# Patient Record
Sex: Male | Born: 1940 | Race: White | Hispanic: No | Marital: Single | Smoking: Former smoker
Health system: Southern US, Community
[De-identification: ages and names within clinical notes are randomized; demographics above are authoritative.]

## PROBLEM LIST (undated history)

## (undated) DIAGNOSIS — E119 Type 2 diabetes mellitus without complications: Secondary | ICD-10-CM

## (undated) DIAGNOSIS — E785 Hyperlipidemia, unspecified: Secondary | ICD-10-CM

## (undated) DIAGNOSIS — E039 Hypothyroidism, unspecified: Secondary | ICD-10-CM

## (undated) DIAGNOSIS — I2510 Atherosclerotic heart disease of native coronary artery without angina pectoris: Secondary | ICD-10-CM

## (undated) HISTORY — DX: Hyperlipidemia, unspecified: E78.5

## (undated) HISTORY — PX: OTHER SURGICAL HISTORY: SHX169

## (undated) HISTORY — DX: Atherosclerotic heart disease of native coronary artery without angina pectoris: I25.10

## (undated) HISTORY — PX: KNEE CARTILAGE SURGERY: SHX688

## (undated) HISTORY — PX: INGUINAL HERNIA REPAIR: SUR1180

---

## 2003-02-15 ENCOUNTER — Ambulatory Visit (HOSPITAL_COMMUNITY): Admission: RE | Admit: 2003-02-15 | Discharge: 2003-02-15 | Payer: Self-pay | Admitting: Orthopaedic Surgery

## 2003-02-15 ENCOUNTER — Ambulatory Visit (HOSPITAL_BASED_OUTPATIENT_CLINIC_OR_DEPARTMENT_OTHER): Admission: RE | Admit: 2003-02-15 | Discharge: 2003-02-15 | Payer: Self-pay | Admitting: Orthopaedic Surgery

## 2004-07-09 ENCOUNTER — Ambulatory Visit: Payer: Self-pay | Admitting: Critical Care Medicine

## 2004-07-17 ENCOUNTER — Ambulatory Visit: Payer: Self-pay | Admitting: Critical Care Medicine

## 2005-09-11 ENCOUNTER — Ambulatory Visit (HOSPITAL_COMMUNITY): Admission: RE | Admit: 2005-09-11 | Discharge: 2005-09-11 | Payer: Self-pay | Admitting: Orthopaedic Surgery

## 2008-05-12 ENCOUNTER — Encounter: Admission: RE | Admit: 2008-05-12 | Discharge: 2008-05-12 | Payer: Self-pay | Admitting: Otolaryngology

## 2010-07-13 NOTE — Op Note (Signed)
[name], [name]                           ACCOUNT NO.:  [id number]   MEDICAL RECORD NO.:  [id number]                   PATIENT TYPE:  AMB   LOCATION:  DSC                                  FACILITY:  [facility]   PHYSICIAN:  Claude Manges. Cleophas Dunker, M.D.            DATE OF BIRTH:  Jun 26, 1940   DATE OF PROCEDURE:  02/15/2003  DATE OF DISCHARGE:                                 OPERATIVE REPORT   PREOPERATIVE DIAGNOSIS:  Tear medial meniscus right knee.   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   PROCEDURE:  1. Diagnostic arthroscopy right knee.  2. Partial medial meniscectomy.  3. Shaving of patella and medial femoral condyle.   SURGEON:  Claude Manges. Cleophas Dunker, M.D.   ANESTHESIA:  Intravenous sedation and local knee block.   COMPLICATIONS:  None.   INDICATIONS FOR PROCEDURE:  The patient is a 70 year old gentleman  who  experienced onset of right knee pain approximately  2 months ago while  walking down some stairs. He has continued to have pain along the medial  compartment of his left knee that has partially responded to cortisone  injection. He has had an MRI scan that reveals a complex tear involving the  posterior  horn of the medial meniscus. He is now to have an arthroscopic  evaluation.   DESCRIPTION OF PROCEDURE:  With the patient comfortable on the operating  table and under IV sedation, the right lower extremity was placed in a thigh  holder. The leg was then prepped with Duraprep from the thigh holder to the  mid foot. Sterile draping was performed. The patient did have a knee block  preoperatively.   A diagnostic arthroscopy was performed using a medial and lateral  peripatellar tendon stab wound. There was a minimal clear joint effusion.  Diagnostic arthroscopy revealed chondromalacia of the patella diffusely. I  did not see a patellar tilt. There were no loose bodies and very minimal  synovitis in the superior  pouch.   Shaving of the patella was performed with a 4.2 coude shaver. Both gutters  were cleared. The lateral  compartment was cleared of meniscal pathology or  chondromalacia. The ACL appeared  to be intact.   The medial compartment revealed a localized area of chondromalacia of the  medial femoral condyle juxtaposed to the posterior  horn of the medial  meniscus. This area was debrided with the coude shaver.   The meniscus was then probed. The posterior third was torn in several  locations, consistent with a complex tear with both horizontal cleavage and  flap tears. These areas were debrided with a basket forceps and then the  intraarticular shaver. The remaining meniscal rim was carefully  probed and  was intact and nicely tapered anteriorly.   The joint  was then explored without evidence of loose material. The 2 stab  wounds were  left open and infiltrated with 0.25% Marcaine with epinephrine.  A sterile bulky dressing  was applied followed  by an Ace bandage.   PLAN:  Percocet  for pain, office in one week.                                               Claude Manges. Cleophas Dunker, M.D.    PWW/MEDQ  D:  02/15/2003  T:  02/15/2003  Job:  [id number]

## 2012-08-02 ENCOUNTER — Encounter (HOSPITAL_COMMUNITY): Payer: Self-pay | Admitting: *Deleted

## 2012-08-02 ENCOUNTER — Inpatient Hospital Stay (HOSPITAL_COMMUNITY)
Admission: EM | Admit: 2012-08-02 | Discharge: 2012-08-04 | DRG: 247 | Disposition: A | Discharge status: Home / Self Care | Payer: Medicare Other | Attending: Cardiology | Admitting: Cardiology

## 2012-08-02 ENCOUNTER — Emergency Department (HOSPITAL_COMMUNITY): Payer: Medicare Other

## 2012-08-02 DIAGNOSIS — Z7902 Long term (current) use of antithrombotics/antiplatelets: Secondary | ICD-10-CM

## 2012-08-02 DIAGNOSIS — I10 Essential (primary) hypertension: Secondary | ICD-10-CM | POA: Diagnosis present

## 2012-08-02 DIAGNOSIS — Z955 Presence of coronary angioplasty implant and graft: Secondary | ICD-10-CM

## 2012-08-02 DIAGNOSIS — Z79899 Other long term (current) drug therapy: Secondary | ICD-10-CM

## 2012-08-02 DIAGNOSIS — E119 Type 2 diabetes mellitus without complications: Secondary | ICD-10-CM | POA: Diagnosis present

## 2012-08-02 DIAGNOSIS — E039 Hypothyroidism, unspecified: Secondary | ICD-10-CM | POA: Diagnosis present

## 2012-08-02 DIAGNOSIS — I2510 Atherosclerotic heart disease of native coronary artery without angina pectoris: Secondary | ICD-10-CM | POA: Diagnosis present

## 2012-08-02 DIAGNOSIS — F17200 Nicotine dependence, unspecified, uncomplicated: Secondary | ICD-10-CM | POA: Diagnosis present

## 2012-08-02 DIAGNOSIS — Z7982 Long term (current) use of aspirin: Secondary | ICD-10-CM

## 2012-08-02 DIAGNOSIS — I214 Non-ST elevation (NSTEMI) myocardial infarction: Principal | ICD-10-CM | POA: Diagnosis present

## 2012-08-02 DIAGNOSIS — Z8249 Family history of ischemic heart disease and other diseases of the circulatory system: Secondary | ICD-10-CM

## 2012-08-02 DIAGNOSIS — I200 Unstable angina: Secondary | ICD-10-CM

## 2012-08-02 HISTORY — DX: Type 2 diabetes mellitus without complications: E11.9

## 2012-08-02 HISTORY — DX: Hypothyroidism, unspecified: E03.9

## 2012-08-02 LAB — POCT I-STAT, CHEM 8
BUN: 18 mg/dL (ref 6–23)
Calcium, Ion: 1.13 mmol/L (ref 1.13–1.30)
Chloride: 101 mEq/L (ref 96–112)
Glucose, Bld: 225 mg/dL — ABNORMAL HIGH (ref 70–99)
Sodium: 134 mEq/L — ABNORMAL LOW (ref 135–145)
TCO2: 25 mmol/L (ref 0–100)

## 2012-08-02 LAB — CBC
HCT: 37.6 % — ABNORMAL LOW (ref 39.0–52.0)
Hemoglobin: 12.7 g/dL — ABNORMAL LOW (ref 13.0–17.0)
MCHC: 33.8 g/dL (ref 30.0–36.0)
Platelets: 165 10*3/uL (ref 150–400)
WBC: 8.4 10*3/uL (ref 4.0–10.5)

## 2012-08-02 LAB — GLUCOSE, CAPILLARY
Glucose-Capillary: 223 mg/dL — ABNORMAL HIGH (ref 70–99)
Glucose-Capillary: 227 mg/dL — ABNORMAL HIGH (ref 70–99)

## 2012-08-02 LAB — POCT I-STAT TROPONIN I: Troponin i, poc: 0.01 ng/mL (ref 0.00–0.08)

## 2012-08-02 LAB — TROPONIN I
Troponin I: 0.33 ng/mL (ref <0.30)
Troponin I: <0.3 ng/mL (ref <0.30)

## 2012-08-02 LAB — PROTIME-INR
INR: 1.01 (ref 0.00–1.49)
INR: 1.09 (ref 0.00–1.49)
Prothrombin Time: 13.2 seconds (ref 11.6–15.2)

## 2012-08-02 LAB — COMPREHENSIVE METABOLIC PANEL
ALT: 31 U/L (ref 0–53)
AST: 28 U/L (ref 0–37)
BUN: 18 mg/dL (ref 6–23)
Calcium: 8.8 mg/dL (ref 8.4–10.5)
Chloride: 97 mEq/L (ref 96–112)
Glucose, Bld: 225 mg/dL — ABNORMAL HIGH (ref 70–99)
Total Protein: 7.1 g/dL (ref 6.0–8.3)

## 2012-08-02 LAB — HEPARIN LEVEL (UNFRACTIONATED): Heparin Unfractionated: 0.28 IU/mL — ABNORMAL LOW (ref 0.30–0.70)

## 2012-08-02 LAB — APTT: aPTT: 29 seconds (ref 24–37)

## 2012-08-02 MED ORDER — NITROGLYCERIN 0.4 MG SL SUBL
SUBLINGUAL_TABLET | SUBLINGUAL | Status: AC
  Administered 2012-08-02: 0.4 mg via SUBLINGUAL
  Filled 2012-08-02: qty 25

## 2012-08-02 MED ORDER — SODIUM CHLORIDE 0.9 % IJ SOLN: 3.0000 mL | INTRAMUSCULAR | Status: DC | PRN

## 2012-08-02 MED ORDER — LEVOTHYROXINE SODIUM 112 MCG PO TABS
112.0000 ug | ORAL_TABLET | Freq: Every day | ORAL | Status: DC
  Administered 2012-08-03 – 2012-08-04 (×2): 112 ug via ORAL
  Filled 2012-08-02 (×4): qty 1

## 2012-08-02 MED ORDER — SODIUM CHLORIDE 0.9 % IV SOLN
INTRAVENOUS | Status: DC
  Administered 2012-08-03: 06:00:00 via INTRAVENOUS

## 2012-08-02 MED ORDER — NITROGLYCERIN 0.4 MG SL SUBL: 0.4000 mg | SUBLINGUAL_TABLET | SUBLINGUAL | Status: DC | PRN

## 2012-08-02 MED ORDER — HEPARIN BOLUS VIA INFUSION: 4000.0000 [IU] | Freq: Once | INTRAVENOUS | Status: DC

## 2012-08-02 MED ORDER — INSULIN ASPART 100 UNIT/ML ~~LOC~~ SOLN
0.0000 [IU] | Freq: Three times a day (TID) | SUBCUTANEOUS | Status: DC
  Administered 2012-08-03: 13:00:00 2 [IU] via SUBCUTANEOUS
  Administered 2012-08-03 – 2012-08-04 (×2): 3 [IU] via SUBCUTANEOUS
  Filled 2012-08-02: qty 0.15

## 2012-08-02 MED ORDER — ASPIRIN 81 MG PO CHEW
324.0000 mg | CHEWABLE_TABLET | ORAL | Status: AC
  Administered 2012-08-03: 324 mg via ORAL
  Filled 2012-08-02: qty 4

## 2012-08-02 MED ORDER — HEPARIN SODIUM (PORCINE) 5000 UNIT/ML IJ SOLN
4000.0000 [IU] | INTRAMUSCULAR | Status: AC
  Administered 2012-08-02: 4000 [IU] via INTRAVENOUS
  Filled 2012-08-02: qty 1

## 2012-08-02 MED ORDER — ATORVASTATIN CALCIUM 80 MG PO TABS
80.0000 mg | ORAL_TABLET | Freq: Every day | ORAL | Status: DC
  Administered 2012-08-03: 80 mg via ORAL
  Filled 2012-08-02 (×3): qty 1

## 2012-08-02 MED ORDER — METOPROLOL TARTRATE 25 MG PO TABS
25.0000 mg | ORAL_TABLET | Freq: Two times a day (BID) | ORAL | Status: DC
  Administered 2012-08-02 – 2012-08-04 (×4): 25 mg via ORAL
  Filled 2012-08-02 (×6): qty 1

## 2012-08-02 MED ORDER — HEPARIN (PORCINE) IN NACL 100-0.45 UNIT/ML-% IJ SOLN
1800.0000 [IU]/h | INTRAMUSCULAR | Status: DC
  Administered 2012-08-02: 1550 [IU]/h via INTRAVENOUS
  Administered 2012-08-02: 1800 [IU]/h via INTRAVENOUS
  Filled 2012-08-02 (×3): qty 250

## 2012-08-02 MED ORDER — NITROGLYCERIN IN D5W 200-5 MCG/ML-% IV SOLN
2.0000 ug/min | Freq: Once | INTRAVENOUS | Status: AC
  Administered 2012-08-02: 5 ug/min via INTRAVENOUS
  Filled 2012-08-02 (×2): qty 250

## 2012-08-02 MED ORDER — ACETAMINOPHEN 325 MG PO TABS: 650.0000 mg | ORAL_TABLET | ORAL | Status: DC | PRN

## 2012-08-02 MED ORDER — SODIUM CHLORIDE 0.9 % IV SOLN: 250.0000 mL | INTRAVENOUS | Status: DC | PRN

## 2012-08-02 MED ORDER — SODIUM CHLORIDE 0.9 % IJ SOLN: 3.0000 mL | Freq: Two times a day (BID) | INTRAMUSCULAR | Status: DC

## 2012-08-02 MED ORDER — SODIUM CHLORIDE 0.9 % IV SOLN
INTRAVENOUS | Status: DC
  Administered 2012-08-02: 20 mL via INTRAVENOUS

## 2012-08-02 MED ORDER — ONDANSETRON HCL 4 MG/2ML IJ SOLN: 4.0000 mg | Freq: Four times a day (QID) | INTRAMUSCULAR | Status: DC | PRN

## 2012-08-02 MED ORDER — ASPIRIN 81 MG PO CHEW
324.0000 mg | CHEWABLE_TABLET | Freq: Once | ORAL | Status: AC
  Administered 2012-08-02: 324 mg via ORAL
  Filled 2012-08-02: qty 4

## 2012-08-02 MED ORDER — GLYBURIDE 5 MG PO TABS
5.0000 mg | ORAL_TABLET | Freq: Two times a day (BID) | ORAL | Status: DC
  Administered 2012-08-03 – 2012-08-04 (×2): 5 mg via ORAL
  Filled 2012-08-02 (×7): qty 1

## 2012-08-02 MED ORDER — ASPIRIN EC 81 MG PO TBEC
81.0000 mg | DELAYED_RELEASE_TABLET | Freq: Every day | ORAL | Status: DC
  Administered 2012-08-04: 10:00:00 81 mg via ORAL
  Filled 2012-08-02: qty 1

## 2012-08-02 NOTE — Consult Note (Addendum)
ANTICOAGULATION CONSULT NOTE - Initial Consult  Pharmacy Consult for Heparin Indication: chest pain/ACS  No Known Allergies  Patient Measurements: Height: 6\' 1"  (185.4 cm) Weight: 285 lb (129.275 kg) IBW/kg (Calculated) : 79.9 Heparin Dosing Weight: ~109kg  Vital Signs: BP: 125/74 mmHg (06/08 1057) Pulse Rate: 75 (06/08 1057)  Labs:  Recent Labs  08/02/12 0931 08/02/12 0952  HGB 12.7* 13.6  HCT 37.6* 40.0  PLT 165  --   APTT 29  --   LABPROT 13.2  --   INR 1.01  --   CREATININE 0.97 1.10    Estimated Creatinine Clearance: 88.1 ml/min (by C-G formula based on Cr of 1.1).   Medical History: Past Medical History  Diagnosis Date  . Diabetes mellitus without complication   . Hypertension   . Thyroid disease     Medications:  No anticoagulants pta  Assessment: 71yom with PMH of diabetes and HTN presents to the ED with CP that has been on and off but worse today. Troponin negative x 1. He will begin heparin. Baseline labs wnl.  Goal of Therapy:  Heparin level 0.3-0.7 units/ml Monitor platelets by anticoagulation protocol: Yes   Plan:  1) Heparin bolus 4000 units x 1 (already given at 0938) 2) Heparin drip at 1550 units/hr 3) 6 hour heparin level 4) Daily heparin level and CBC  Fredrik Rigger 08/02/2012,11:20 AM

## 2012-08-02 NOTE — ED Provider Notes (Signed)
History     CSN: [id number]  Arrival date & time 08/02/12  6962   First MD Initiated Contact with Patient 08/02/12 [phone]      Chief Complaint  Patient presents with  . Chest Pain    (Consider location/radiation/quality/duration/timing/severity/associated sxs/prior treatment) HPI Comments: Patient presents with epigastric chest pain and pressure that onset one hour ago associated with nausea and shortness of breath. He is diaphoretic. EKG shows ST depressions in the inferior leads without reciprocal changes. This is discussed with dr.  Kirke Corin on arrival and he did not think STEMI criteria was met. Patient is a diabetic and hypertensive who has been having chest pain and pressure on and off for greater than 6 months but worse this morning. Denies any vomiting, fever or cough. States he had a stress test maybe 3 years ago. He smokes cigars.  The history is provided by the patient and a relative.    Past Medical History  Diagnosis Date  . Diabetes mellitus without complication   . Hypertension   . Thyroid disease     History reviewed. No pertinent past surgical history.  History reviewed. No pertinent family history.  History  Substance Use Topics  . Smoking status: Current Every Day Smoker    Types: Cigars  . Smokeless tobacco: Not on file  . Alcohol Use: No      Review of Systems  Constitutional: Positive for activity change and appetite change. Negative for fever.  HENT: Negative for congestion and rhinorrhea.   Respiratory: Positive for chest tightness and shortness of breath. Negative for cough.   Cardiovascular: Positive for chest pain.  Gastrointestinal: Positive for nausea. Negative for vomiting and abdominal pain.  Genitourinary: Negative for dysuria, hematuria and testicular pain.  Musculoskeletal: Negative for back pain.  Skin: Negative for rash.  Neurological: Positive for dizziness and light-headedness. Negative for headaches.  A complete 10 system review of  systems was obtained and all systems are negative except as noted in the HPI and PMH.    Allergies  Review of patient's allergies indicates no known allergies.  Home Medications   Current Outpatient Rx  Name  Route  Sig  Dispense  Refill  . aspirin EC 81 MG tablet   Oral   Take 81 mg by mouth daily.         Marland Kitchen glyBURIDE-metformin (GLUCOVANCE) 2.5-500 MG per tablet   Oral   Take 2 tablets by mouth 2 (two) times daily with a meal.         . levothyroxine (SYNTHROID, LEVOTHROID) 112 MCG tablet   Oral   Take 112 mcg by mouth daily before breakfast.         . Misc Natural Products (GLUCOS-CHONDROIT-MSM COMPLEX PO)   Oral   Take 3 tablets by mouth daily.           BP 125/74  Pulse 72  Resp 15  Ht 6\' 1"  (1.854 m)  Wt 285 lb (129.275 kg)  BMI 37.61 kg/m2  SpO2 98%  Physical Exam  Constitutional: He is oriented to person, place, and time. He appears well-developed and well-nourished. No distress.  pale  HENT:  Head: Normocephalic and atraumatic.  Mouth/Throat: Oropharynx is clear and moist. No oropharyngeal exudate.  Eyes: Conjunctivae and EOM are normal. Pupils are equal, round, and reactive to light.  Neck: Normal range of motion. Neck supple.  Cardiovascular: Normal rate, regular rhythm, normal heart sounds and intact distal pulses.   No murmur heard. Equal radial, DP, PT pulses  Pulmonary/Chest: Effort normal. No respiratory distress.  Abdominal: Soft. There is no tenderness. There is no rebound and no guarding.  Musculoskeletal: Normal range of motion. He exhibits no edema and no tenderness.  Neurological: He is alert and oriented to person, place, and time. No cranial nerve deficit. He exhibits normal muscle tone. Coordination normal.  Skin: Skin is warm.    ED Course  Procedures (including critical care time)  Labs Reviewed  CBC - Abnormal; Notable for the following:    Hemoglobin 12.7 (*)    HCT 37.6 (*)    All other components within normal limits   COMPREHENSIVE METABOLIC PANEL - Abnormal; Notable for the following:    Sodium 130 (*)    Glucose, Bld 225 (*)    Albumin 3.4 (*)    GFR calc non Af Amer 82 (*)    All other components within normal limits  POCT I-STAT, CHEM 8 - Abnormal; Notable for the following:    Sodium 134 (*)    Glucose, Bld 225 (*)    All other components within normal limits  APTT  PROTIME-INR  POCT I-STAT TROPONIN I   Dg Chest Portable 1 View  08/02/2012   *RADIOLOGY REPORT*  Clinical Data: Chest pain.  Shortness of breath.  Weakness.  PORTABLE CHEST - 1 VIEW  Comparison: No priors.  Findings: Lung volumes are normal.  No consolidative airspace disease.  No pleural effusions.  No pneumothorax.  No pulmonary nodule or mass noted.  Pulmonary vasculature and the cardiomediastinal silhouette are within normal limits.  IMPRESSION: 1. No radiographic evidence of acute cardiopulmonary disease.   Original Report Authenticated By: Trudie Reed, M.D.     1. Unstable angina       MDM  Chest pain with nausea and shortness of breath concerning for unstable angina. EKG shows inferior ST depressions without reciprocal change. This is discussed with Dr. Kirke Corin arrival who do not think STEMI criteria was met.  Initial troponin is negative.  Patient's pain was improved throughout his stay in the ED. Nitroglycerin started as he is pain-free. Heparin drip continue. He already received aspirin.  Dr. Antoine Poche consulted for admission.   Date: 08/02/2012  Rate: 87  Rhythm: normal sinus rhythm  QRS Axis: normal  Intervals: normal  ST/T Wave abnormalities: ST depressions inferiorly  Conduction Disutrbances:none  Narrative Interpretation:   Old EKG Reviewed: changes noted  CRITICAL CARE Performed by: Glynn Octave Total critical care time: 30 Critical care time was exclusive of separately billable procedures and treating other patients. Critical care was necessary to treat or prevent imminent or life-threatening  deterioration. Critical care was time spent personally by me on the following activities: development of treatment plan with patient and/or surrogate as well as nursing, discussions with consultants, evaluation of patient's response to treatment, examination of patient, obtaining history from patient or surrogate, ordering and performing treatments and interventions, ordering and review of laboratory studies, ordering and review of radiographic studies, pulse oximetry and re-evaluation of patient's condition.       Glynn Octave, MD 08/02/12 [phone]

## 2012-08-02 NOTE — H&P (Signed)
Admission History and Physical   Patient ID: Benjamin Ramsey, MRN: [id number], DOB: Dec 17, 1940 72 y.o. Date of Encounter: 08/02/2012, 3:20 PM  Primary Physician: No primary provider on file. Primary Cardiologist: new (Dr. Rollene Rotunda)  Chief Complaint:  Chest Pain  History of Present Illness: Benjamin Ramsey is a 72 y.o. male with a history of diabetes and hypothyroidism. Over the last 3 months, he has noted exertional chest discomfort that has progressively gotten worse. He has noted it recently with minimal exertion. 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 He began to feel much worse well reading a letter from the Bishop. He decided to leave mass and come to the emergency room. His initial ECG demonstrated minimal ST depression in inferior leads. This was apparently reviewed with the on-call interventionalist. It was not felt to meet criteria for STEMI. He was treated with IV heparin, aspirin and nitroglycerin.  Initial troponin was normal. Follow up troponin is abnormal. He is now being admitted for further evaluation and management of a non-STEMI. He is currently pain-free on IV heparin.  He denies any recent history of significant dyspnea, syncope, orthopnea, PND or significant pedal edema.   Past Medical History  Diagnosis Date  . Diabetes mellitus without complication   . Hypothyroidism     Past Surgical History  Procedure Laterality Date  . Left biceps tendon repair    . Knee cartilage surgery Bilateral   . Inguinal hernia repair Left       Current Facility-Administered Medications  Medication Dose Route Frequency Provider Last Rate Last Dose  . 0.9 %  sodium chloride infusion   Intravenous Continuous Glynn Octave, MD 20 mL/hr at 08/02/12 0941 20 mL at 08/02/12 0941  . heparin ADULT infusion 100 units/mL (25000 units/250 mL)  1,550 Units/hr Intravenous Continuous Fredrik Rigger, RPH 15.5 mL/hr at 08/02/12 1141 1,550 Units/hr at 08/02/12 1141  . nitroGLYCERIN 0.2 mg/mL in dextrose 5 % infusion  2-200 mcg/min Intravenous Once Glynn Octave, MD       Current Outpatient Prescriptions  Medication Sig Dispense Refill  . aspirin EC 81 MG tablet Take 81 mg by mouth daily.      Marland Kitchen glyBURIDE-metformin (GLUCOVANCE) 2.5-500 MG per tablet Take 2 tablets by mouth 2 (two) times daily with a meal.      . levothyroxine (SYNTHROID, LEVOTHROID) 112 MCG tablet Take 112 mcg by mouth daily before breakfast.      . Misc Natural Products (GLUCOS-CHONDROIT-MSM COMPLEX PO) Take 3 tablets by mouth daily.         Allergies: No Known Allergies   Social History:  The patient  reports that he has been smoking Cigars.  He does not have any smokeless tobacco history on file. He reports that he does not drink alcohol or use illicit drugs.  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    Family History:  The patient's family history includes Heart attack in his father. (presumed)  ROS:  Please see the history of present illness.     All other systems reviewed and negative.   Vital Signs: Blood pressure 151/77, pulse 76, resp. rate 10, height 6\' 1"  (1.854 m), weight 285 lb (129.275 kg), SpO2 99.00%.  PHYSICAL EXAM: General:  Well nourished, well developed, in no acute distress HEENT: normal Lymph: no adenopathy Neck: no JVD Endocrine:  No thryomegaly Vascular: No carotid bruits;  DP/PT 2+ bilat Cardiac:  normal S1, S2; RRR; no murmur Lungs:  clear to auscultation bilaterally, no wheezing, rhonchi or rales Abd: soft, nontender, no hepatomegaly Ext: no edema Musculoskeletal:  No deformities, BUE and BLE strength normal and equal Skin: warm and dry Neuro:  CNs 2-12 intact, no focal abnormalities noted Psych:  Normal affect   EKG:  NSR, HR 87, normal axis, min (< 1 mm) inf ST depression  Labs:   Lab Results  Component Value Date   WBC 8.4 08/02/2012   HGB  13.6 08/02/2012   HCT 40.0 08/02/2012   MCV 81.9 08/02/2012   PLT 165 08/02/2012     Recent Labs Lab 08/02/12 0931 08/02/12 0952  NA 130* 134*  K 4.0 4.1  CL 97 101  CO2 24  --   BUN 18 18  CREATININE 0.97 1.10  CALCIUM 8.8  --   PROT 7.1  --   BILITOT 0.3  --   ALKPHOS 63  --   ALT 31  --   AST 28  --   GLUCOSE 225* 225*    Recent Labs  08/02/12 1224  TROPONINI 0.33*    Radiology/Studies:  Dg Chest Portable 1 View  08/02/2012    IMPRESSION: 1. No radiographic evidence of acute cardiopulmonary disease.   Original Report Authenticated By: Trudie Reed, M.D.     ASSESSMENT AND PLAN:   1. Non-STEMI:  Pain free.  Admit to telemetry.  Continue Heparin, ASA, NTG gtt.  Start Metoprolol 25 mg bid, Lipitor 80 mg QD. If BP remains elevated, consider adding ACE inhibitor.  Risks and benefits of cardiac catheterization have been discussed with the patient.  These include bleeding, infection, kidney damage, stroke, heart attack, death.  The patient understands these risks and is willing to proceed.  Will put on cath board for tomorrow.  2. Diabetes Mellitus:  Hold Metformin for cath tomorrow. Cover with SSI.  3. Hypothyroidism:  Continue current regimen.    Signed,  Tereso Newcomer, PA-C 08/02/2012, 3:20 PM   History and all data above reviewed.  Patient examined.  I agree with the findings as above. The patient has had several weeks of chest pain increasing with minimal activity to rest (Class III -IV).  Now presents with enzyme elevation.  Pt is currently pain free.  The patient exam reveals COR:RRR  ,  Lungs: Clear  ,  Abd: Positive bowel sounds, no rebound no guarding, Ext No edema  .  All available labs, radiology testing, previous records reviewed. Agree with documented assessment and plan. NQWMI.  Plan cath.  The patient understands that risks included but are not limited to stroke (1 in 1000), death (1 in 1000), kidney failure [usually temporary] (1 in 500), bleeding (1 in 200), allergic  reaction [possibly serious] (1 in 200).  The patient understands and agrees to proceed.   Meds as above.   Fayrene Fearing Zyquan Crotty  3:30 PM  08/02/2012

## 2012-08-02 NOTE — ED Notes (Signed)
Dr. Jacqualine Code at the bedside

## 2012-08-02 NOTE — ED Notes (Signed)
Clicked on height and weight by mistake.  Let Lawson Fiscal, RN know.

## 2012-08-02 NOTE — ED Notes (Signed)
Pt reports mid chest pain/pressure for over 6 months but no relief with am with otc acid reflux meds. ekg done at triage, no acute distress noted.

## 2012-08-02 NOTE — ED Notes (Signed)
Reported pt.s  Elevated Troponin 0.33 to Dr. Virl Axe.

## 2012-08-02 NOTE — Progress Notes (Signed)
ANTICOAGULATION CONSULT NOTE - Follow Up Consult  Pharmacy Consult for Heparin Indication: chest pain/ACS  No Known Allergies  Patient Measurements: Height: 6\' 1"  (185.4 cm) Weight: 285 lb (129.275 kg) IBW/kg (Calculated) : 79.9 Heparin Dosing Weight: 109kg  Vital Signs: Temp: 97.9 F (36.6 C) (06/08 1823) Temp src: Oral (06/08 1823) BP: 149/78 mmHg (06/08 1823) Pulse Rate: 67 (06/08 1823)  Labs:  Recent Labs  08/02/12 0931 08/02/12 0952 08/02/12 1224 08/02/12 1750  HGB 12.7* 13.6  --   --   HCT 37.6* 40.0  --   --   PLT 165  --   --   --   APTT 29  --   --   --   LABPROT 13.2  --   --   --   INR 1.01  --   --   --   HEPARINUNFRC  --   --   --  0.28*  CREATININE 0.97 1.10  --   --   TROPONINI  --   --  0.33*  --     Estimated Creatinine Clearance: 88.1 ml/min (by C-G formula based on Cr of 1.1).   Medications:  Heparin 1550 units/hr   Assessment: 71yom on heparin for CP/ACS. Heparin level (0.28) is just below goal level - will increase rate and check 6 hour follow-up level. No problems with line/infusion per RN. - H/H and Plts wnl - No significant bleeding reported  Goal of Therapy:  Heparin level 0.3-0.7 units/ml Monitor platelets by anticoagulation protocol: Yes   Plan:  1.  Increase heparin drip to 1800 units/hr (18 ml/hr) 2. Check heparin level 6 hours after rate increase   Cleon Dew [phone] 08/02/2012,7:01 PM

## 2012-08-03 ENCOUNTER — Encounter (HOSPITAL_COMMUNITY)
Admission: EM | Disposition: A | Discharge status: Home / Self Care | Payer: Self-pay | Source: Home / Self Care | Attending: Cardiology

## 2012-08-03 DIAGNOSIS — I2510 Atherosclerotic heart disease of native coronary artery without angina pectoris: Secondary | ICD-10-CM

## 2012-08-03 DIAGNOSIS — I214 Non-ST elevation (NSTEMI) myocardial infarction: Secondary | ICD-10-CM

## 2012-08-03 HISTORY — PX: LEFT HEART CATHETERIZATION WITH CORONARY ANGIOGRAM: SHX5451

## 2012-08-03 LAB — HEPARIN LEVEL (UNFRACTIONATED): Heparin Unfractionated: 0.44 IU/mL (ref 0.30–0.70)

## 2012-08-03 LAB — LIPID PANEL
Cholesterol: 177 mg/dL (ref 0–200)
HDL: 48 mg/dL (ref >39)
Total CHOL/HDL Ratio: 3.7 RATIO
Triglycerides: 115 mg/dL (ref <150)

## 2012-08-03 LAB — POCT ACTIVATED CLOTTING TIME: Activated Clotting Time: 470 seconds

## 2012-08-03 LAB — TROPONIN I: Troponin I: <0.3 ng/mL (ref <0.30)

## 2012-08-03 LAB — CBC
MCV: 82.7 fL (ref 78.0–100.0)
Platelets: 167 10*3/uL (ref 150–400)
RBC: 4.21 MIL/uL — ABNORMAL LOW (ref 4.22–5.81)
RDW: 15.9 % — ABNORMAL HIGH (ref 11.5–15.5)
WBC: 8.4 10*3/uL (ref 4.0–10.5)

## 2012-08-03 LAB — GLUCOSE, CAPILLARY
Glucose-Capillary: 172 mg/dL — ABNORMAL HIGH (ref 70–99)
Glucose-Capillary: 138 mg/dL — ABNORMAL HIGH (ref 70–99)

## 2012-08-03 LAB — HEMOGLOBIN A1C
Hgb A1c MFr Bld: 8.6 % — ABNORMAL HIGH (ref <5.7)
Mean Plasma Glucose: 200 mg/dL — ABNORMAL HIGH (ref <117)

## 2012-08-03 SURGERY — LEFT HEART CATHETERIZATION WITH CORONARY ANGIOGRAM
Anesthesia: LOCAL

## 2012-08-03 MED ORDER — HEPARIN SODIUM (PORCINE) 1000 UNIT/ML IJ SOLN
INTRAMUSCULAR | Status: AC
  Filled 2012-08-03: qty 1

## 2012-08-03 MED ORDER — SODIUM CHLORIDE 0.9 % IV SOLN: INTRAVENOUS | Status: AC

## 2012-08-03 MED ORDER — MIDAZOLAM HCL 2 MG/2ML IJ SOLN
INTRAMUSCULAR | Status: AC
  Filled 2012-08-03: qty 2

## 2012-08-03 MED ORDER — TICAGRELOR 90 MG PO TABS
90.0000 mg | ORAL_TABLET | Freq: Two times a day (BID) | ORAL | Status: DC
  Administered 2012-08-03: 21:00:00 90 mg via ORAL
  Filled 2012-08-03 (×3): qty 1

## 2012-08-03 MED ORDER — ACETAMINOPHEN 325 MG PO TABS: 650.0000 mg | ORAL_TABLET | ORAL | Status: DC | PRN

## 2012-08-03 MED ORDER — SODIUM CHLORIDE 0.9 % IV SOLN
0.2500 mg/kg/h | INTRAVENOUS | Status: DC
  Filled 2012-08-03: qty 250

## 2012-08-03 MED ORDER — TICAGRELOR 90 MG PO TABS
ORAL_TABLET | ORAL | Status: AC
  Administered 2012-08-04: 90 mg via ORAL
  Filled 2012-08-03: qty 2

## 2012-08-03 MED ORDER — OXYCODONE-ACETAMINOPHEN 5-325 MG PO TABS
1.0000 | ORAL_TABLET | ORAL | Status: DC | PRN
  Administered 2012-08-03: 19:00:00 1 via ORAL
  Filled 2012-08-03: qty 1

## 2012-08-03 MED ORDER — LIDOCAINE HCL (PF) 1 % IJ SOLN
INTRAMUSCULAR | Status: AC
  Filled 2012-08-03: qty 30

## 2012-08-03 MED ORDER — DIAZEPAM 2 MG PO TABS: 2.0000 mg | ORAL_TABLET | ORAL | Status: DC | PRN

## 2012-08-03 MED ORDER — FENTANYL CITRATE 0.05 MG/ML IJ SOLN
INTRAMUSCULAR | Status: AC
  Filled 2012-08-03: qty 2

## 2012-08-03 MED ORDER — ONDANSETRON HCL 4 MG/2ML IJ SOLN: 4.0000 mg | Freq: Four times a day (QID) | INTRAMUSCULAR | Status: DC | PRN

## 2012-08-03 MED ORDER — HEPARIN (PORCINE) IN NACL 2-0.9 UNIT/ML-% IJ SOLN
INTRAMUSCULAR | Status: AC
  Filled 2012-08-03: qty 1000

## 2012-08-03 MED ORDER — VERAPAMIL HCL 2.5 MG/ML IV SOLN
INTRAVENOUS | Status: AC
  Filled 2012-08-03: qty 2

## 2012-08-03 MED ORDER — BIVALIRUDIN 250 MG IV SOLR
INTRAVENOUS | Status: AC
  Filled 2012-08-03: qty 250

## 2012-08-03 NOTE — Interval H&P Note (Signed)
History and Physical Interval Note:  08/03/2012 9:57 AM  Benjamin Ramsey  has presented today for surgery, with the diagnosis of cp  The various methods of treatment have been discussed with the patient and family. After consideration of risks, benefits and other options for treatment, the patient has consented to  Procedure(s): LEFT HEART CATHETERIZATION WITH CORONARY ANGIOGRAM (N/A) as a surgical intervention .  The patient's history has been reviewed, patient examined, no change in status, stable for surgery.  I have reviewed the patient's chart and labs.  Questions were answered to the patient's satisfaction.     [name]

## 2012-08-03 NOTE — Progress Notes (Signed)
Pt has remained pain free since IV NTG started.  VS remain stable. Pt without complaints/will continue to monitor-pt for cardiac cath tomorrow. Dierdre Highman, RN

## 2012-08-03 NOTE — CV Procedure (Signed)
   Cardiac Catheterization Procedure Note  Name: Benjamin Ramsey MRN: [id number] DOB: 29-Nov-1940  Procedure: Left Heart Cath, Selective Coronary Angiography, LV angiography, PTCA and stenting of the proximal LAD  Indication: 72 year old gentleman with type 2 diabetes, presenting with non-ST elevation MI. No past history of cardiac disease. Referred for cardiac catheterization and possible PCI.  Procedural Details:  The right wrist was prepped, draped, and anesthetized with 1% lidocaine. Using the modified Seldinger technique, a 5/6 French sheath was introduced into the right radial artery. 3 mg of verapamil was administered through the sheath, weight-based unfractionated heparin was administered intravenously. Standard Judkins catheters were used for selective coronary angiography and left ventriculography. Catheter exchanges were performed over an exchange length guidewire.  PROCEDURAL FINDINGS Hemodynamics: AO 109/64 with a mean of 85 LV 107/21   Coronary angiography: Coronary dominance: right  Left mainstem: Widely patent with no obstructive disease. There is minor regularity. The vessel divides into the LAD and left circumflex.  Left anterior descending (LAD): The LAD has severe proximal stenosis. Just beyond the ostium there is 95% stenosis with severe segmental plaquing across the second diagonal branch. Beyond this area, the mid and distal LAD are free of significant disease. The first diagonal is tiny in caliber, the second diagonal is large in caliber with mild diffuse segmental plaque.  Left circumflex (LCx): The left circumflex is patent. This is a large vessel with 2 moderate to large caliber obtuse marginal branches. There is mild irregularity but no significant stenoses are present.  Right coronary artery (RCA): The right coronary artery is a large-caliber, dominant vessel. There is no significant stenosis noted. The vessel supplies the PDA and a PLA branch. The PDA is moderate  in caliber, the PLA is small in caliber.  Left ventriculography: Left ventricular systolic function is normal, LVEF is estimated at 55-65%, there is no significant mitral regurgitation   PCI Note:  Following the diagnostic procedure, the decision was made to proceed with PCI. The patient was loaded with brilinta 180 mg. Weight-based bivalirudin was given for anticoagulation. Once a therapeutic ACT was achieved, a 6 [place] XB LAD 3.5 cm guide catheter was inserted.  A BMW coronary guidewire was used to cross the lesion.  The lesion was predilated with a 2.5 x 20 mm balloon.  The lesion was then stented with a 3.0 x 24 mm Promus premier drug-eluting stent.  The stent was postdilated with a 3.25 mm noncompliant balloon to a maximum pressure of 16 atmospheres.  Following PCI, there was 0% residual stenosis and TIMI-3 flow. The large diagonal branch remained widely patent. Final angiography confirmed an excellent result. The patient tolerated the procedure well. There were no immediate procedural complications. A TR band was used for radial hemostasis. The patient was transferred to the post catheterization recovery area for further monitoring.  PCI Data: Vessel - LAD/Segment - proximal Percent Stenosis (pre)  95 TIMI-flow 3 Stent 3.0 x 24 mm Promus premier drug-eluting Percent Stenosis (post) 0 TIMI-flow (post) 3  Final Conclusions:   1. Severe single-vessel coronary artery disease involving the proximal LAD, treated successfully with PCI 2. Minor nonobstructive stenosis of the left main, left circumflex, and right coronary arteries 3. Normal left ventricular systolic function   Recommendations:  Dual antiplatelet therapy with aspirin and brilinta for 12 months. Aggressive risk reduction measures.  [name] 08/03/2012, 10:57 AM

## 2012-08-03 NOTE — Progress Notes (Signed)
Utilization Review Completed Haziel Molner J. Maleik Vanderzee, RN, BSN, NCM [phone]  

## 2012-08-03 NOTE — Progress Notes (Signed)
TR BAND REMOVAL  LOCATION:    right radial  DEFLATED PER PROTOCOL:    yes  TIME BAND OFF / DRESSING APPLIED:    1430   SITE UPON ARRIVAL:    Level 0  SITE AFTER BAND REMOVAL:    Level 0  REVERSE ALLEN'S TEST:     positive  CIRCULATION SENSATION AND MOVEMENT:    Within Normal Limits   yes  COMMENTS:  Tolerated procedure well 

## 2012-08-03 NOTE — Progress Notes (Signed)
Inpatient Diabetes Program Recommendations  AACE/ADA: New Consensus Statement on Inpatient Glycemic Control (2013)  Target Ranges:  Prepandial:   less than 140 mg/dL      Peak postprandial:   less than 180 mg/dL (1-2 hours)      Critically ill patients:  140 - 180 mg/dL   Reason for Visit: Results for SUVAN, STCYR (MRN [id number]) as of 08/03/2012 09:22  Ref. Range 08/02/2012 18:27 08/02/2012 20:58 08/03/2012 07:36  Glucose-Capillary Latest Range: 70-99 mg/dL 086 (H) 578 (H) 469 (H)   Note history of diabetes.  A1C=8.6% indicating sub-optimal control of blood glucoses prior to admit.  Consider adding Lantus 15 units daily while in the hospital.  Will need follow up with PCP after discharge.   Will follow.

## 2012-08-03 NOTE — Progress Notes (Signed)
Late entry for 08/02/12 at 2040 EVENT NOTE  At approx 8pm pt OOB to [facility] and stated he started having chest pain 8/10.  Initial BP 143/69 HR 99 O2 sats 97% on RA.  O2 applied at 2L/[place], EKG obtained with diffuse T wave inversion ( prior EKG not in chart and not able to view in EPIC for comparison).  Pt given 1 SL NTG with some relief-BP 134/70 HR 91-pt stated pain 4/10.  Second NTG given -BP remained stable 130/70 HR 90.  Pt pain free after second SL NTG.  MD on call, Dr. Tresa Endo, paged and notified/orders received to start pt on NTG gtt and limit activity.  Dierdre Highman, RN

## 2012-08-03 NOTE — Progress Notes (Signed)
Inpatient Diabetes Program Recommendations  AACE/ADA: New Consensus Statement on Inpatient Glycemic Control (2013)  Target Ranges:  Prepandial:   less than 140 mg/dL      Peak postprandial:   less than 180 mg/dL (1-2 hours)      Critically ill patients:  140 - 180 mg/dL   Reason for Visit: Spoke to patient post-cath regarding home diabetes management.  He states that he has been under a lot of stress and has been forgetting to take diabetes medications. We discussed his A1C=8.6% and he stated "that is the highest it has ever been".  He states that he does not want to go on insulin like his father was on.  Discussed other medication choices and encouraged patient to follow-up with PCP Dr. Nolen Mu in [place] regarding plan.  He has seen diabetes educator in the past.  Patient seemed motivated and states the he will do better.  He has meter and strips at home.  He plans to follow-up with PCP next week.

## 2012-08-03 NOTE — Progress Notes (Signed)
ANTICOAGULATION CONSULT NOTE - Follow Up Consult  Pharmacy Consult for heparin Indication: chest pain/ACS  Labs:  Recent Labs  08/02/12 0931 08/02/12 0952 08/02/12 1224 08/02/12 1750 08/02/12 1943 08/02/12 1944 08/03/12 0005  HGB 12.7* 13.6  --   --   --   --   --   HCT 37.6* 40.0  --   --   --   --   --   PLT 165  --   --   --   --   --   --   APTT 29  --   --   --   --  66*  --   LABPROT 13.2  --   --   --   --  14.0  --   INR 1.01  --   --   --   --  1.09  --   HEPARINUNFRC  --   --   --  0.28*  --   --  0.44  CREATININE 0.97 1.10  --   --   --   --   --   TROPONINI  --   --  0.33*  --  <0.30  --   --     Assessment/Plan:  72yo male now therapeutic on heparin after rate increase.  Will continue gtt at current rate and confirm stable with am labs.  Vernard Gambles, PharmD, BCPS  08/03/2012,12:45 AM

## 2012-08-04 DIAGNOSIS — I2510 Atherosclerotic heart disease of native coronary artery without angina pectoris: Secondary | ICD-10-CM

## 2012-08-04 LAB — BASIC METABOLIC PANEL
CO2: 27 mEq/L (ref 19–32)
Calcium: 8.7 mg/dL (ref 8.4–10.5)
Chloride: 102 mEq/L (ref 96–112)
GFR calc Af Amer: 85 mL/min — ABNORMAL LOW (ref >90)
Potassium: 4 mEq/L (ref 3.5–5.1)
Sodium: 136 mEq/L (ref 135–145)

## 2012-08-04 LAB — CBC
Hemoglobin: 12.4 g/dL — ABNORMAL LOW (ref 13.0–17.0)
Platelets: 154 10*3/uL (ref 150–400)
RBC: 4.53 MIL/uL (ref 4.22–5.81)
WBC: 8.4 10*3/uL (ref 4.0–10.5)

## 2012-08-04 MED ORDER — GLYBURIDE-METFORMIN 2.5-500 MG PO TABS: 2.0000 | ORAL_TABLET | Freq: Two times a day (BID) | ORAL | Status: DC

## 2012-08-04 MED ORDER — ATORVASTATIN CALCIUM 80 MG PO TABS: 80.0000 mg | ORAL_TABLET | Freq: Every day | ORAL | Status: DC

## 2012-08-04 MED ORDER — NITROGLYCERIN 0.4 MG SL SUBL: 0.4000 mg | SUBLINGUAL_TABLET | SUBLINGUAL | Status: AC | PRN

## 2012-08-04 MED ORDER — METOPROLOL TARTRATE 25 MG PO TABS: 25.0000 mg | ORAL_TABLET | Freq: Two times a day (BID) | ORAL | Status: DC

## 2012-08-04 MED ORDER — TICAGRELOR 90 MG PO TABS: 90.0000 mg | ORAL_TABLET | Freq: Two times a day (BID) | ORAL | Status: DC

## 2012-08-04 MED FILL — Sodium Chloride IV Soln 0.9%: INTRAVENOUS | Qty: 50 | Status: AC

## 2012-08-04 NOTE — Progress Notes (Signed)
Patient Name: Benjamin Ramsey Date of Encounter: 08/04/2012   Principal Problem:   NSTEMI (non-ST elevated myocardial infarction) Active Problems:   CAD (coronary artery disease)   Type 2 diabetes mellitus   Hypothyroidism   SUBJECTIVE  C/o dull chest sensation throughout the night, "like it remembers that something was wrong."  CURRENT MEDS . aspirin EC  81 mg Oral Daily  . atorvastatin  80 mg Oral q1800  . glyBURIDE  5 mg Oral BID WC  . insulin aspart  0-15 Units Subcutaneous TID WC  . levothyroxine  112 mcg Oral QAC breakfast  . metoprolol tartrate  25 mg Oral BID  . Ticagrelor  90 mg Oral BID   OBJECTIVE  Filed Vitals:   08/03/12 2112 08/03/12 2120 08/04/12 0032 08/04/12 0534  BP: 141/75 141/75 107/76 135/84  Pulse: 65 67 60 62  Temp: 97.3 F (36.3 C)  97.6 F (36.4 C) 97.3 F (36.3 C)  TempSrc: Oral  Oral Oral  Resp: 17  15 17   Height:      Weight:   285 lb 7.9 oz (129.5 kg)   SpO2: 97%  100% 100%    Intake/Output Summary (Last 24 hours) at 08/04/12 0732 Last data filed at 08/04/12 0038  Gross per 24 hour  Intake   1200 ml  Output   1650 ml  Net   -450 ml   Filed Weights   08/02/12 1103 08/03/12 0700 08/04/12 0032  Weight: 285 lb (129.275 kg) 284 lb 4.8 oz (128.958 kg) 285 lb 7.9 oz (129.5 kg)   PHYSICAL EXAM  General: Pleasant, NAD. Neuro: Alert and oriented X 3. Moves all extremities spontaneously. Psych: Normal affect. HEENT:  Normal  Neck: Supple without bruits or JVD. Lungs:  Resp regular and unlabored, CTA. Heart: RRR no s3, s4, or murmurs. Abdomen: Soft, non-tender, non-distended, BS + x 4.  Extremities: No clubbing, cyanosis or edema. DP/PT/Radials 2+ and equal bilaterally.  R wrist w/o bleeding/bruit/hematoma.  Accessory Clinical Findings  CBC  Recent Labs  08/03/12 0350 08/04/12 0618  WBC 8.4 8.4  HGB 11.7* 12.4*  HCT 34.8* 37.6*  MCV 82.7 83.0  PLT 167 154   Basic Metabolic Panel  Recent Labs  08/02/12 0931  08/02/12 0952 08/04/12 0618  NA 130* 134* 136  K 4.0 4.1 4.0  CL 97 101 102  CO2 24  --  27  GLUCOSE 225* 225* 161*  BUN 18 18 14   CREATININE 0.97 1.10 1.01  CALCIUM 8.8  --  8.7   Liver Function Tests  Recent Labs  08/02/12 0931  AST 28  ALT 31  ALKPHOS 63  BILITOT 0.3  PROT 7.1  ALBUMIN 3.4*   Cardiac Enzymes  Recent Labs  08/02/12 1943 08/03/12 0006 08/03/12 0350  TROPONINI <0.30 <0.30 <0.30   Hemoglobin A1C  Recent Labs  08/02/12 1944  HGBA1C 8.6*   Fasting Lipid Panel  Recent Labs  08/03/12 0350  CHOL 177  HDL 48  LDLCALC 106*  TRIG 115  CHOLHDL 3.7   Thyroid Function Tests  Recent Labs  08/02/12 1944  TSH 13.013*   TELE  rsr  ECG  Rsr, 60, twi I, aVL, V2, ant infarct.  Radiology/Studies  Dg Chest Portable 1 View  08/02/2012   *RADIOLOGY REPORT*  Clinical Data: Chest pain.  Shortness of breath.  Weakness.  PORTABLE CHEST - 1 VIEW  Comparison: No priors.  Findings: Lung volumes are normal.  No consolidative airspace disease.  No pleural effusions.  No  pneumothorax.  No pulmonary nodule or mass noted.  Pulmonary vasculature and the cardiomediastinal silhouette are within normal limits.  IMPRESSION: 1. No radiographic evidence of acute cardiopulmonary disease.   Original Report Authenticated By: Trudie Reed, M.D.   ASSESSMENT AND PLAN  1.  NSTEMI/CAD:  S/p cath/pci/des to the LAD yesterday.  No recurrent chest pain.  Cardiac rehab to see this AM.  Cont asa, statin, bb, brilinta.  2.  HTN:  BP variable.  Cont bb.  3.  HL:  LDL 106.  Cont high potency statin.  4.  DM:  A1c 8.6.  On glyburide/metformin.  Will need primary care f/u and adjustment @ home.  5.  Hypothyroidism:  TSH 13.013.  Check Free T4.   Signed, Nicolasa Ducking NP  History and all data above reviewed.  Patient examined.  I agree with the findings as above. He has had vague mild residual chest pain.  He has no SOB.  The patient exam reveals COR:RRR  ,  Lungs:  Clear  ,  Abd: Positive bowel sounds, no rebound no guarding, Ext Right wrist without erythema or oozing  .  All available labs, radiology testing, previous records reviewed. Agree with documented assessment and plan. PCI of LAD.  Plan discharge.  Follow up in our office in two weeks.   Benjamin Ramsey  9:07 AM  08/04/2012

## 2012-08-04 NOTE — Progress Notes (Signed)
CARDIAC REHAB PHASE I   PRE:  Rate/Rhythm: 70 SR    BP: sitting 116/65    SaO2:   MODE:  Ambulation: 600 ft   POST:  Rate/Rhythm: 82 SR    BP: sitting 131/73     SaO2:   Some SOB walking up hill. O/w did well. Ed completed with good reception. Does st he has indigestion feeling now. Did not take PPI today. Interested in [facility] and requests his name be sent to [facility]. [phone]   Elissa [place] CES, ACSM 08/04/2012 10:12 AM

## 2012-08-04 NOTE — Care Management Note (Signed)
    Page 1 of 1   08/04/2012     10:35:46 AM   CARE MANAGEMENT NOTE 08/04/2012  Patient:  Benjamin Ramsey, Benjamin Ramsey   Account Number:  [id number]  Date Initiated:  08/04/2012  Documentation initiated by:  Oletta Cohn  Subjective/Objective Assessment:   72 y.o. male with a history of diabetes and hypothyroidism. Over the last 3 months, he has noted exertional chest discomfort that has progressively gotten worse. He has noted it recently with minimal exertion.     Action/Plan:   Heart Cath/ Brilinta benefits check will notify pt when info comes available.   Anticipated DC Date:  08/04/2012   Anticipated DC Plan:  HOME/SELF CARE      DC Planning Services  CM consult      Choice offered to / List presented to:             Status of service:  Completed, signed off Medicare Important Message given?   (If response is "NO", the following Medicare IM given date fields will be blank) Date Medicare IM given:   Date Additional Medicare IM given:    Discharge Disposition:    Per UR Regulation:  Reviewed for med. necessity/level of care/duration of stay  If discussed at Long Length of Stay Meetings, dates discussed:    Comments:  08/04/12 @1000 .Marland KitchenMarland KitchenOletta Cohn, RN, BSN, [place] [phone] Spoke to pt at bedside about benefits check of Brilinta 90mg  BID.  Pt states that he utilizes [facility] in [place].  NCM called pharmacy to ensure Brilinta in stock.

## 2012-08-04 NOTE — Discharge Summary (Signed)
Patient ID: Benjamin Ramsey,  MRN: [id number], DOB/AGE: 1940/12/04 72 y.o.  Admit date: 08/02/2012 Discharge date: 08/04/2012  Primary Cardiologist: J. Aviella Disbrow, MD   Discharge Diagnoses Principal Problem:   NSTEMI (non-ST elevated myocardial infarction)  **s/p Cath/PCI this admission with successful placement of a 3.0 x 35m Promus Premier DES in the proximal LAD.  Active Problems:   CAD (coronary artery disease)   Type 2 diabetes mellitus   Hypothyroidism  Allergies No Known Allergies  Procedures  Cardiac Catheterization and Percutaneous Coronary Intervention 6.9.2014  PROCEDURAL FINDINGS Hemodynamics: AO 109/64 with a mean of 85 LV 107/21              Coronary angiography: Coronary dominance: right  Left mainstem: Widely patent with no obstructive disease. There is minor regularity. The vessel divides into the LAD and left circumflex. Left anterior descending (LAD): The LAD has severe proximal stenosis. Just beyond the ostium there is 95% stenosis with severe segmental plaquing across the second diagonal branch. Beyond this area, the mid and distal LAD are free of significant disease. The first diagonal is tiny in caliber, the second diagonal is large in caliber with mild diffuse segmental plaque.   **The proximal LAD was successfully stented using a 3.0 x 24 mm Promus Premier DES**  Left circumflex (LCx): The left circumflex is patent. This is a large vessel with 2 moderate to large caliber obtuse marginal branches. There is mild irregularity but no significant stenoses are present. Right coronary artery (RCA): The right coronary artery is a large-caliber, dominant vessel. There is no significant stenosis noted. The vessel supplies the PDA and a PLA branch. The PDA is moderate in caliber, the PLA is small in caliber.  Left ventriculography: Left ventricular systolic function is normal, LVEF is estimated at 55-65%, there is no significant mitral regurgitation  _____________     History of Present Illness  72 year old male without prior history of coronary artery disease. He was in his usual state of health until approximately 3 months prior to admission when he began to experience intermittent exertional chest discomfort which has gotten progressively worse. On the day of admission, he developed recurrent chest discomfort prompting him to present to the [facility] where initial ECG showed ST segment depression and inferiorly. He was treated with heparin, aspirin and nitroglycerin with eventual relief of discomfort. Although initial troponin was normal, subsequent troponin was abnormal and the patient was admitted for further management of non-ST segment elevation myocardial infarction.  Hospital Course  Following admission, patient was placed on heparin and nitroglycerin. Aspirin, beta blocker, and hypodensity statin were also added. His home dose of metformin was held in preparation for diagnostic catheterization. Though his troponin was 0.33 in the ED, subsequent troponins were normal. He had no further chest pain and underwent diagnostic catheterization on June 9, revealing significant proximal LAD disease and otherwise nonobstructive CAD with normal LV function. The LAD was successfully stented using a 3.0 x 24 mm Promus Premier drug-eluting stent. Patient tolerated procedure well and post procedure has been has noted a mild dull sensation across his chest but has been otherwise ambulating without difficulty. His ECG remained stable. He'll be discharged home today in good condition with followup arranged in 7 days.  Of note, patient's TSH was found to be elevated at 13.013. He has not had any recent dose adjustments. We have sent off a free T4 and this result is not currently back. We will follow this up when he is seen in the office  next week as he may require adjustment of his Synthroid dose.  Discharge Vitals Blood pressure 161/85, pulse 59, temperature 97.5 F (36.4 C),  temperature source Oral, resp. rate 18, height 6\' 1"  (1.854 m), weight 285 lb 7.9 oz (129.5 kg), SpO2 100.00%.  Filed Weights   08/02/12 1103 08/03/12 0700 08/04/12 0032  Weight: 285 lb (129.275 kg) 284 lb 4.8 oz (128.958 kg) 285 lb 7.9 oz (129.5 kg)   Labs  CBC  Recent Labs  08/03/12 0350 08/04/12 0618  WBC 8.4 8.4  HGB 11.7* 12.4*  HCT 34.8* 37.6*  MCV 82.7 83.0  PLT 167 154   Basic Metabolic Panel  Recent Labs  08/02/12 0931 08/02/12 0952 08/04/12 0618  NA 130* 134* 136  K 4.0 4.1 4.0  CL 97 101 102  CO2 24  --  27  GLUCOSE 225* 225* 161*  BUN 18 18 14   CREATININE 0.97 1.10 1.01  CALCIUM 8.8  --  8.7   Liver Function Tests  Recent Labs  08/02/12 0931  AST 28  ALT 31  ALKPHOS 63  BILITOT 0.3  PROT 7.1  ALBUMIN 3.4*   Cardiac Enzymes  Recent Labs  08/02/12 1943 08/03/12 0006 08/03/12 0350  TROPONINI <0.30 <0.30 <0.30   Hemoglobin A1C  Recent Labs  08/02/12 1944  HGBA1C 8.6*   Fasting Lipid Panel  Recent Labs  08/03/12 0350  CHOL 177  HDL 48  LDLCALC 106*  TRIG 115  CHOLHDL 3.7   Thyroid Function Tests  Recent Labs  08/02/12 1944  TSH 13.013*   Disposition  Pt is being discharged home today in good condition.  Follow-up Plans & Appointments  Follow-up Information   Follow up with Rick Duff, PA-C On 08/11/2012. (1:30 PM - Dr. Jenene Slicker PA)    Contact information:   [address] [place] [address] [phone]     Discharge Medications    Medication List    TAKE these medications       aspirin EC 81 MG tablet  Take 81 mg by mouth daily.     atorvastatin 80 MG tablet  Commonly known as:  LIPITOR  Take 1 tablet (80 mg total) by mouth daily at 6 PM.     GLUCOS-CHONDROIT-MSM COMPLEX PO  Take 3 tablets by mouth daily.     glyBURIDE-metformin 2.5-500 MG per tablet  Commonly known as:  GLUCOVANCE  Take 2 tablets by mouth 2 (two) times daily with a meal.     levothyroxine 112 MCG tablet    Commonly known as:  SYNTHROID, LEVOTHROID  Take 112 mcg by mouth daily before breakfast.     metoprolol tartrate 25 MG tablet  Commonly known as:  LOPRESSOR  Take 1 tablet (25 mg total) by mouth 2 (two) times daily.     nitroGLYCERIN 0.4 MG SL tablet  Commonly known as:  NITROSTAT  Place 1 tablet (0.4 mg total) under the tongue every 5 (five) minutes x 3 doses as needed for chest pain.     Ticagrelor 90 MG Tabs tablet  Commonly known as:  BRILINTA  Take 1 tablet (90 mg total) by mouth 2 (two) times daily.      Outstanding Labs/Studies  Free T4 checked prior to d/c;  F/u lipids/lft's in 8 wks.  Duration of Discharge Encounter   Greater than 30 minutes including physician time.  Signed, Nicolasa Ducking NP 08/04/2012, 10:26 AM   Patient seen and examined.  Plan as discussed in my rounding note  for today and outlined above. Fayrene Fearing [facility]  08/04/2012  11:56 AM

## 2012-08-10 NOTE — Progress Notes (Addendum)
CARDIOLOGY OFFICE NOTE  Patient ID: Benjamin Ramsey MRN: [id number], DOB/AGE: 1940/10/28   Date of Visit: 08/11/2012  Primary Physician: Margaree Mackintosh, MD Primary Cardiologist: Antoine Poche, MD Reason for Visit: Hospital follow-up  History of Present Illness  Benjamin Ramsey is a 72 year old man who was admitted to [facility] last week with NSTEMI. He underwent cardiac catheterization on June 9, revealing significant proximal LAD disease and otherwise nonobstructive CAD with normal LV function. The LAD was successfully stented using a 3.0 x 24 mm Promus Premier drug-eluting stent. He remained hemodynamically stable and was discharged on 08/04/2012. Of note, his TSH was found to be elevated at 13.0; however, subsequent free T4 was normal.  He presents today for hospital followup and reports he is doing well. His only concern today is fatigue, not enough get-up-and-go. Otherwise he has no complaints. He denies chest pain or shortness of breath. He denies palpitations, dizziness, near syncope or syncope. He denies LE swelling, orthopnea, PND or recent weight gain. Mr. Dinapoli states that he is compliant and tolerating medications without difficulty. He reports his right wrist site is bruised but seems to be healing okay.  Past Medical History Past Medical History  Diagnosis Date  . Diabetes mellitus without complication   . Hypothyroidism   . Coronary artery disease     s/p NSTEMI, PCI to LAD June 2014  . Hyperlipidemia     Past Surgical History Past Surgical History  Procedure Laterality Date  . Left biceps tendon repair    . Knee cartilage surgery Bilateral   . Inguinal hernia repair Left     Allergies/Intolerances No Known Allergies  Current Home Medications Current Outpatient Prescriptions  Medication Sig Dispense Refill  . aspirin EC 81 MG tablet Take 81 mg by mouth daily.      Marland Kitchen atorvastatin (LIPITOR) 80 MG tablet Take 1 tablet (80 mg total) by mouth daily at 6 PM.  30 tablet  6    . glyBURIDE-metformin (GLUCOVANCE) 2.5-500 MG per tablet Take 2 tablets by mouth 2 (two) times daily with a meal.      . levothyroxine (SYNTHROID, LEVOTHROID) 112 MCG tablet Take 112 mcg by mouth daily before breakfast.      . metoprolol tartrate (LOPRESSOR) 25 MG tablet Take 1 tablet (25 mg total) by mouth 2 (two) times daily.  60 tablet  6  . nitroGLYCERIN (NITROSTAT) 0.4 MG SL tablet Place 1 tablet (0.4 mg total) under the tongue every 5 (five) minutes x 3 doses as needed for chest pain.  25 tablet  3  . Ticagrelor (BRILINTA) 90 MG TABS tablet Take 1 tablet (90 mg total) by mouth 2 (two) times daily.  60 tablet  6   No current facility-administered medications for this visit.   Social History Social History  . Marital Status: Single   Social History Main Topics  . Smoking status: Current Every Day Smoker    Types: Cigars only  . Smokeless tobacco: No  . Alcohol Use: No  . Drug Use: No   Social History Narrative   Retired from [employer]scape [employer].  Serves as Copywriter, advertising at [employer]    Review of Systems General: No chills, fever, night sweats or weight changes Cardiovascular: No chest pain, dyspnea on exertion, edema, orthopnea, palpitations, paroxysmal nocturnal dyspnea Dermatological: No rash, lesions or masses Respiratory: No cough, dyspnea Urologic: No hematuria, dysuria Abdominal: No nausea, vomiting, diarrhea, bright red blood per rectum, melena, or hematemesis Neurologic: No visual changes, weakness, changes in mental  status All other systems reviewed and are otherwise negative except as noted above.  Physical Exam Blood pressure 118/60, pulse 60, height 6\' 3"  (1.905 m), weight 285 lb 6.4 oz (129.457 kg).  General: Well developed, well appearing 72 year old male in no acute distress. HEENT: Normocephalic, atraumatic. EOMs intact. Sclera nonicteric. Oropharynx clear.  Neck: Supple. No JVD. Lungs: Respirations regular and unlabored, CTA bilaterally. No wheezes, rales or  rhonchi. Heart: RRR. S1, S2 present. No murmurs, rub, S3 or S4. Abdomen: Soft, non-distended.  Extremities: No clubbing, cyanosis or edema. PT/Radials 2+ and equal bilaterally. Right wrist site intact with mild ecchymosis, +pulse, well healing without bleeding or hematoma.  Psych: Normal affect. Neuro: Alert and oriented X 3. Moves all extremities spontaneously.   Diagnostics Cardiac catheterization 08/03/2012 PROCEDURAL FINDINGS  Hemodynamics:  AO 109/64 with a mean of 85  LV 107/21  Coronary angiography:  Coronary dominance: right  Left mainstem: Widely patent with no obstructive disease. There is minor regularity. The vessel divides into the LAD and left circumflex.  Left anterior descending (LAD): The LAD has severe proximal stenosis. Just beyond the ostium there is 95% stenosis with severe segmental plaquing across the second diagonal branch. Beyond this area, the mid and distal LAD are free of significant disease. The first diagonal is tiny in caliber, the second diagonal is large in caliber with mild diffuse segmental plaque.  Left circumflex (LCx): The left circumflex is patent. This is a large vessel with 2 moderate to large caliber obtuse marginal branches. There is mild irregularity but no significant stenoses are present.  Right coronary artery (RCA): The right coronary artery is a large-caliber, dominant vessel. There is no significant stenosis noted. The vessel supplies the PDA and a PLA branch. The PDA is moderate in caliber, the PLA is small in caliber.  Left ventriculography: Left ventricular systolic function is normal, LVEF is estimated at 55-65% and there is no significant mitral regurgitation.   PCI Data:  Vessel - LAD/Segment - proximal  Percent Stenosis (pre) 95  TIMI-flow 3  Stent 3.0 x 24 mm Promus premier drug-eluting  Percent Stenosis (post) 0  TIMI-flow (post) 3   Final Conclusions:  1. Severe single-vessel coronary artery disease involving the proximal LAD,  treated successfully with PCI  2. Minor nonobstructive stenosis of the left main, left circumflex, and right coronary arteries  3. Normal left ventricular systolic function  Recommendations:  Dual antiplatelet therapy with aspirin and brilinta for 12 months. Aggressive risk reduction measures.   Assessment and Plan 1. CAD s/p recent NSTEMI, PCI to LAD Stable without anginal symptoms Preserved LV function Continue ASA and Brilinta x 12 months Continue BB and statin Continue aggressive secondary prevention / risk factor modification with low fat, low cholesterol diet Refer to cardiac rehab Return to clinic for follow-up with Dr. Antoine Poche in 4-6 weeks 2. Fatigue Mostly likely related to BB which is new for him Reassured him that it may take 1-2 weeks to acclimate to the medication Discussed the benefits and importance of BB in setting of CAD/NSTEMI  Offered to reduce dose to 12.5 mg twice daily but he will continue the current dose for the next week or so and see how he feels 3. HTN Stable; normotensive today Continue current regimen  Signed, Dezaray Shibuya, PA-C 08/11/2012, 2:14 PM

## 2012-08-11 ENCOUNTER — Ambulatory Visit (INDEPENDENT_AMBULATORY_CARE_PROVIDER_SITE_OTHER): Payer: Medicare Other | Admitting: Cardiology

## 2012-08-11 ENCOUNTER — Encounter: Payer: Self-pay | Admitting: Cardiology

## 2012-08-11 VITALS — BP 118/60 | HR 60 | Ht 75.0 in | Wt 285.4 lb

## 2012-08-11 DIAGNOSIS — Z9861 Coronary angioplasty status: Secondary | ICD-10-CM

## 2012-08-11 DIAGNOSIS — Z955 Presence of coronary angioplasty implant and graft: Secondary | ICD-10-CM

## 2012-08-11 DIAGNOSIS — R5383 Other fatigue: Secondary | ICD-10-CM

## 2012-08-11 DIAGNOSIS — I2510 Atherosclerotic heart disease of native coronary artery without angina pectoris: Secondary | ICD-10-CM

## 2012-08-11 DIAGNOSIS — I214 Non-ST elevation (NSTEMI) myocardial infarction: Secondary | ICD-10-CM

## 2012-08-11 NOTE — Patient Instructions (Addendum)
Your physician recommends that you schedule a follow-up appointment in: 4-6 WEEKS WITH DR Shannon [facility]

## 2012-09-17 ENCOUNTER — Encounter: Payer: Self-pay | Admitting: Cardiology

## 2012-09-17 ENCOUNTER — Ambulatory Visit (INDEPENDENT_AMBULATORY_CARE_PROVIDER_SITE_OTHER): Payer: Medicare Other | Admitting: Cardiology

## 2012-09-17 VITALS — BP 115/70 | HR 60 | Ht 75.0 in | Wt 281.1 lb

## 2012-09-17 DIAGNOSIS — I2510 Atherosclerotic heart disease of native coronary artery without angina pectoris: Secondary | ICD-10-CM

## 2012-09-17 DIAGNOSIS — Z79899 Other long term (current) drug therapy: Secondary | ICD-10-CM

## 2012-09-17 LAB — CBC
Hemoglobin: 12.9 g/dL — ABNORMAL LOW (ref 13.0–17.0)
MCHC: 33 g/dL (ref 30.0–36.0)
MCV: 87.4 fl (ref 78.0–100.0)
Platelets: 190 10*3/uL (ref 150.0–400.0)
RBC: 4.49 Mil/uL (ref 4.22–5.81)

## 2012-09-17 NOTE — Progress Notes (Signed)
HPI The patient is being seen for follow up after a hospitalization for NSTEMI. He underwent cardiac catheterization on June 9, revealing significant proximal LAD disease and otherwise nonobstructive CAD with normal LV function. The LAD was successfully stented using a 3.0 x 24 mm Promus Premier drug-eluting stent.   Since I last saw him he has done well.  The patient denies any new symptoms such as chest discomfort, neck or arm discomfort. There has been no new shortness of breath, PND or orthopnea. There have been no reported palpitations, presyncope or syncope.  He is going to participate in cardiac rehab. He did have a nose bleed recently and he has had some easy bruising.    No Known Allergies  Current Outpatient Prescriptions  Medication Sig Dispense Refill  . aspirin EC 81 MG tablet Take 81 mg by mouth daily.      Marland Kitchen atorvastatin (LIPITOR) 80 MG tablet Take 1 tablet (80 mg total) by mouth daily at 6 PM.  30 tablet  6  . glyBURIDE-metformin (GLUCOVANCE) 2.5-500 MG per tablet Take 2 tablets by mouth 2 (two) times daily with a meal.      . levothyroxine (SYNTHROID, LEVOTHROID) 112 MCG tablet Take 112 mcg by mouth daily before breakfast.      . metoprolol tartrate (LOPRESSOR) 25 MG tablet Take 1 tablet (25 mg total) by mouth 2 (two) times daily.  60 tablet  6  . nitroGLYCERIN (NITROSTAT) 0.4 MG SL tablet Place 1 tablet (0.4 mg total) under the tongue every 5 (five) minutes x 3 doses as needed for chest pain.  25 tablet  3  . Ticagrelor (BRILINTA) 90 MG TABS tablet Take 1 tablet (90 mg total) by mouth 2 (two) times daily.  60 tablet  6   No current facility-administered medications for this visit.    Past Medical History  Diagnosis Date  . Diabetes mellitus without complication   . Hypothyroidism   . Coronary artery disease     s/p NSTEMI, PCI to LAD June 2014  . Hyperlipidemia     Past Surgical History  Procedure Laterality Date  . Left biceps tendon repair    . Knee cartilage  surgery Bilateral   . Inguinal hernia repair Left     ROS:  As stated in the HPI and negative for all other systems.  PHYSICAL EXAM BP 115/70  Pulse 60  Ht 6\' 3"  (1.905 m)  Wt 281 lb 1.9 oz (127.515 kg)  BMI 35.14 kg/m2 GENERAL:  Well appearing HEENT:  Pupils equal round and reactive, fundi not visualized, oral mucosa unremarkable NECK:  No jugular venous distention, waveform within normal limits, carotid upstroke brisk and symmetric, no bruits, no thyromegaly LYMPHATICS:  No cervical, inguinal adenopathy LUNGS:  Clear to auscultation bilaterally BACK:  No CVA tenderness CHEST:  Unremarkable HEART:  PMI not displaced or sustained,S1 and S2 within normal limits, no S3, no S4, no clicks, no rubs, no murmurs ABD:  Flat, positive bowel sounds normal in frequency in pitch, no bruits, no rebound, no guarding, no midline pulsatile mass, no hepatomegaly, no splenomegaly EXT:  2 plus pulses throughout, no edema, no cyanosis no clubbing SKIN:  No rashes no nodules NEURO:  Cranial nerves II through XII grossly intact, motor grossly intact throughout PSYCH:  Cognitively intact, oriented to person place and time   ASSESSMENT AND PLAN  CAD:  The patient has no new sypmtoms.  No further cardiovascular testing is indicated.  We will continue with aggressive risk reduction  and meds as listed. Because of his nose bleeding and bruising I will check a CBC.  DYSLIPIDEMIA:  He will remain on the Lipitor and he will have a lipid level at the next visit.  OBESITY:   The patient understands the need to lose weight with diet and exercise. We have discussed specific strategies for this.

## 2012-09-17 NOTE — Patient Instructions (Addendum)
The current medical regimen is effective;  continue present plan and medications.  Please have blood work today.  Follow up with Dr Antoine Poche in 4 months.

## 2012-09-18 ENCOUNTER — Telehealth: Payer: Self-pay | Admitting: Cardiology

## 2012-09-18 NOTE — Telephone Encounter (Signed)
PT RTN CALL TO Katina Dung

## 2012-09-18 NOTE — Telephone Encounter (Signed)
Spoke with patient about recent lab results 

## 2012-10-08 ENCOUNTER — Encounter (HOSPITAL_COMMUNITY)
Admission: RE | Admit: 2012-10-08 | Discharge: 2012-10-08 | Disposition: A | Discharge status: Home / Self Care | Payer: Medicare Other | Source: Ambulatory Visit | Attending: Cardiology | Admitting: Cardiology

## 2012-10-08 DIAGNOSIS — I2510 Atherosclerotic heart disease of native coronary artery without angina pectoris: Secondary | ICD-10-CM | POA: Insufficient documentation

## 2012-10-08 DIAGNOSIS — Z7982 Long term (current) use of aspirin: Secondary | ICD-10-CM | POA: Insufficient documentation

## 2012-10-08 DIAGNOSIS — Z7902 Long term (current) use of antithrombotics/antiplatelets: Secondary | ICD-10-CM | POA: Insufficient documentation

## 2012-10-08 DIAGNOSIS — I10 Essential (primary) hypertension: Secondary | ICD-10-CM | POA: Insufficient documentation

## 2012-10-08 DIAGNOSIS — Z042 Encounter for examination and observation following work accident: Secondary | ICD-10-CM | POA: Insufficient documentation

## 2012-10-08 DIAGNOSIS — I214 Non-ST elevation (NSTEMI) myocardial infarction: Secondary | ICD-10-CM | POA: Insufficient documentation

## 2012-10-08 DIAGNOSIS — Z5189 Encounter for other specified aftercare: Secondary | ICD-10-CM | POA: Insufficient documentation

## 2012-10-08 NOTE — Progress Notes (Signed)
Cardiac Rehab Medication Review by a Pharmacist  Does the patient  feel that his/her medications are working for him/her?  yes  Has the patient been experiencing any side effects to the medications prescribed?  no  Does the patient measure his/her own blood pressure or blood glucose at home?  no   Does the patient have any problems obtaining medications due to transportation or finances?   no  Understanding of regimen: good Understanding of indications: good Potential of compliance: good    Pharmacist comments:  Mr. Mineau is a 72 yo male who came with list of medications today, appears to have good understanding of medications. He has NKA and no reported side effects, with the exception of minor bruising from his ticagrelor.  Tyrone Nine. Artelia Laroche, PharmD Clinical Pharmacist - Resident Pager: [phone] Phone: [phone] 10/08/2012 9:37 AM

## 2012-10-12 ENCOUNTER — Encounter (HOSPITAL_COMMUNITY)
Admission: RE | Admit: 2012-10-12 | Discharge: 2012-10-12 | Disposition: A | Discharge status: Home / Self Care | Payer: Medicare Other | Source: Ambulatory Visit | Attending: Cardiology | Admitting: Cardiology

## 2012-10-12 NOTE — Progress Notes (Signed)
Pt started cardiac rehab today.  Pt tolerated light exercise without difficulty. Vital signs stable. Will continue to monitor the patient throughout  the program.  

## 2012-10-14 ENCOUNTER — Encounter (HOSPITAL_COMMUNITY)
Admission: RE | Admit: 2012-10-14 | Discharge: 2012-10-14 | Disposition: A | Discharge status: Home / Self Care | Payer: Medicare Other | Source: Ambulatory Visit | Attending: Cardiology | Admitting: Cardiology

## 2012-10-14 LAB — GLUCOSE, CAPILLARY
Glucose-Capillary: 123 mg/dL — ABNORMAL HIGH (ref 70–99)
Glucose-Capillary: 121 mg/dL — ABNORMAL HIGH (ref 70–99)

## 2012-10-14 NOTE — Progress Notes (Signed)
Post exercise CBG 75.  Patient asymptomatic.  Benjamin Ramsey was given a banana and some lemonade.  Benjamin Ramsey said he ate lunch at 12:30pm. Encouraged Benjamin Ramsey to eat a snack before he comes to exercise on Friday.  Repeat CBG 121 upon exit from cardiac rehab.

## 2012-10-16 ENCOUNTER — Encounter (HOSPITAL_COMMUNITY)
Admission: RE | Admit: 2012-10-16 | Discharge: 2012-10-16 | Disposition: A | Discharge status: Home / Self Care | Payer: Medicare Other | Source: Ambulatory Visit | Attending: Cardiology | Admitting: Cardiology

## 2012-10-16 LAB — GLUCOSE, CAPILLARY
Glucose-Capillary: 127 mg/dL — ABNORMAL HIGH (ref 70–99)
Glucose-Capillary: 95 mg/dL (ref 70–99)

## 2012-10-19 ENCOUNTER — Encounter (HOSPITAL_COMMUNITY)
Admission: RE | Admit: 2012-10-19 | Discharge: 2012-10-19 | Disposition: A | Discharge status: Home / Self Care | Payer: Medicare Other | Source: Ambulatory Visit | Attending: Cardiology | Admitting: Cardiology

## 2012-10-21 ENCOUNTER — Encounter (HOSPITAL_COMMUNITY)
Admission: RE | Admit: 2012-10-21 | Discharge: 2012-10-21 | Disposition: A | Discharge status: Home / Self Care | Payer: Medicare Other | Source: Ambulatory Visit | Attending: Cardiology | Admitting: Cardiology

## 2012-10-21 LAB — GLUCOSE, CAPILLARY: Glucose-Capillary: 268 mg/dL — ABNORMAL HIGH (ref 70–99)

## 2012-10-23 ENCOUNTER — Encounter (HOSPITAL_COMMUNITY)
Admission: RE | Admit: 2012-10-23 | Discharge: 2012-10-23 | Disposition: A | Discharge status: Home / Self Care | Payer: Medicare Other | Source: Ambulatory Visit | Attending: Cardiology | Admitting: Cardiology

## 2012-10-23 NOTE — Progress Notes (Signed)
Reviewed home exercise with pt and wife today.  Pt plans to walk and bike at home for exercise.  Reviewed THR, pulse, RPE, sign and symptoms, NTG use, and when to call 911 or MD.  Pt and wife  voiced understanding. Fabio Pierce, MA, ACSM RCEP

## 2012-10-28 ENCOUNTER — Encounter (HOSPITAL_COMMUNITY)
Admission: RE | Admit: 2012-10-28 | Discharge: 2012-10-28 | Disposition: A | Discharge status: Home / Self Care | Payer: Medicare Other | Source: Ambulatory Visit | Attending: Cardiology | Admitting: Cardiology

## 2012-10-28 DIAGNOSIS — I214 Non-ST elevation (NSTEMI) myocardial infarction: Secondary | ICD-10-CM | POA: Insufficient documentation

## 2012-10-28 DIAGNOSIS — Z7902 Long term (current) use of antithrombotics/antiplatelets: Secondary | ICD-10-CM | POA: Insufficient documentation

## 2012-10-28 DIAGNOSIS — Z5189 Encounter for other specified aftercare: Secondary | ICD-10-CM | POA: Insufficient documentation

## 2012-10-28 DIAGNOSIS — I2510 Atherosclerotic heart disease of native coronary artery without angina pectoris: Secondary | ICD-10-CM | POA: Insufficient documentation

## 2012-10-28 DIAGNOSIS — Z042 Encounter for examination and observation following work accident: Secondary | ICD-10-CM | POA: Insufficient documentation

## 2012-10-28 DIAGNOSIS — Z7982 Long term (current) use of aspirin: Secondary | ICD-10-CM | POA: Insufficient documentation

## 2012-10-28 DIAGNOSIS — I10 Essential (primary) hypertension: Secondary | ICD-10-CM | POA: Insufficient documentation

## 2012-10-28 LAB — GLUCOSE, CAPILLARY: Glucose-Capillary: 141 mg/dL — ABNORMAL HIGH (ref 70–99)

## 2012-10-30 ENCOUNTER — Encounter (HOSPITAL_COMMUNITY)
Admission: RE | Admit: 2012-10-30 | Discharge: 2012-10-30 | Disposition: A | Discharge status: Home / Self Care | Payer: Medicare Other | Source: Ambulatory Visit | Attending: Cardiology | Admitting: Cardiology

## 2012-10-30 LAB — GLUCOSE, CAPILLARY: Glucose-Capillary: 121 mg/dL — ABNORMAL HIGH (ref 70–99)

## 2012-11-02 ENCOUNTER — Encounter (HOSPITAL_COMMUNITY)
Admission: RE | Admit: 2012-11-02 | Discharge: 2012-11-02 | Disposition: A | Discharge status: Home / Self Care | Payer: Medicare Other | Source: Ambulatory Visit | Attending: Cardiology | Admitting: Cardiology

## 2012-11-04 ENCOUNTER — Encounter (HOSPITAL_COMMUNITY)
Admission: RE | Admit: 2012-11-04 | Discharge: 2012-11-04 | Disposition: A | Discharge status: Home / Self Care | Payer: Medicare Other | Source: Ambulatory Visit | Attending: Cardiology | Admitting: Cardiology

## 2012-11-04 LAB — GLUCOSE, CAPILLARY: Glucose-Capillary: 154 mg/dL — ABNORMAL HIGH (ref 70–99)

## 2012-11-06 ENCOUNTER — Encounter (HOSPITAL_COMMUNITY)
Admission: RE | Admit: 2012-11-06 | Discharge: 2012-11-06 | Disposition: A | Discharge status: Home / Self Care | Payer: Medicare Other | Source: Ambulatory Visit | Attending: Cardiology | Admitting: Cardiology

## 2012-11-06 LAB — GLUCOSE, CAPILLARY: Glucose-Capillary: 145 mg/dL — ABNORMAL HIGH (ref 70–99)

## 2012-11-09 ENCOUNTER — Encounter (HOSPITAL_COMMUNITY)
Admission: RE | Admit: 2012-11-09 | Discharge: 2012-11-09 | Disposition: A | Discharge status: Home / Self Care | Payer: Medicare Other | Source: Ambulatory Visit | Attending: Cardiology | Admitting: Cardiology

## 2012-11-09 LAB — GLUCOSE, CAPILLARY: Glucose-Capillary: 132 mg/dL — ABNORMAL HIGH (ref 70–99)

## 2012-11-11 ENCOUNTER — Encounter (HOSPITAL_COMMUNITY)
Admission: RE | Admit: 2012-11-11 | Discharge: 2012-11-11 | Disposition: A | Discharge status: Home / Self Care | Payer: Medicare Other | Source: Ambulatory Visit | Attending: Cardiology | Admitting: Cardiology

## 2012-11-13 ENCOUNTER — Encounter (HOSPITAL_COMMUNITY)
Admission: RE | Admit: 2012-11-13 | Discharge: 2012-11-13 | Disposition: A | Discharge status: Home / Self Care | Payer: Medicare Other | Source: Ambulatory Visit | Attending: Cardiology | Admitting: Cardiology

## 2012-11-16 ENCOUNTER — Encounter (HOSPITAL_COMMUNITY)
Admission: RE | Admit: 2012-11-16 | Discharge: 2012-11-16 | Disposition: A | Discharge status: Home / Self Care | Payer: Medicare Other | Source: Ambulatory Visit | Attending: Cardiology | Admitting: Cardiology

## 2012-11-18 ENCOUNTER — Encounter (HOSPITAL_COMMUNITY)
Admission: RE | Admit: 2012-11-18 | Discharge: 2012-11-18 | Disposition: A | Discharge status: Home / Self Care | Payer: Medicare Other | Source: Ambulatory Visit | Attending: Cardiology | Admitting: Cardiology

## 2012-11-20 ENCOUNTER — Encounter (HOSPITAL_COMMUNITY)
Admission: RE | Admit: 2012-11-20 | Discharge: 2012-11-20 | Disposition: A | Discharge status: Home / Self Care | Payer: Medicare Other | Source: Ambulatory Visit | Attending: Cardiology | Admitting: Cardiology

## 2012-11-23 ENCOUNTER — Encounter (HOSPITAL_COMMUNITY)
Admission: RE | Admit: 2012-11-23 | Discharge: 2012-11-23 | Disposition: A | Discharge status: Home / Self Care | Payer: Medicare Other | Source: Ambulatory Visit | Attending: Cardiology | Admitting: Cardiology

## 2012-11-25 ENCOUNTER — Encounter (HOSPITAL_COMMUNITY)
Admission: RE | Admit: 2012-11-25 | Discharge: 2012-11-25 | Disposition: A | Discharge status: Home / Self Care | Payer: Medicare Other | Source: Ambulatory Visit | Attending: Cardiology | Admitting: Cardiology

## 2012-11-25 DIAGNOSIS — Z042 Encounter for examination and observation following work accident: Secondary | ICD-10-CM | POA: Insufficient documentation

## 2012-11-25 DIAGNOSIS — I214 Non-ST elevation (NSTEMI) myocardial infarction: Secondary | ICD-10-CM | POA: Insufficient documentation

## 2012-11-25 DIAGNOSIS — I2510 Atherosclerotic heart disease of native coronary artery without angina pectoris: Secondary | ICD-10-CM | POA: Insufficient documentation

## 2012-11-25 DIAGNOSIS — Z7902 Long term (current) use of antithrombotics/antiplatelets: Secondary | ICD-10-CM | POA: Insufficient documentation

## 2012-11-25 DIAGNOSIS — Z5189 Encounter for other specified aftercare: Secondary | ICD-10-CM | POA: Insufficient documentation

## 2012-11-25 DIAGNOSIS — I10 Essential (primary) hypertension: Secondary | ICD-10-CM | POA: Insufficient documentation

## 2012-11-25 DIAGNOSIS — Z7982 Long term (current) use of aspirin: Secondary | ICD-10-CM | POA: Insufficient documentation

## 2012-11-27 ENCOUNTER — Encounter (HOSPITAL_COMMUNITY)
Admission: RE | Admit: 2012-11-27 | Discharge: 2012-11-27 | Disposition: A | Discharge status: Home / Self Care | Payer: Medicare Other | Source: Ambulatory Visit | Attending: Cardiology | Admitting: Cardiology

## 2012-11-30 ENCOUNTER — Encounter (HOSPITAL_COMMUNITY)
Admission: RE | Admit: 2012-11-30 | Discharge: 2012-11-30 | Disposition: A | Discharge status: Home / Self Care | Payer: Medicare Other | Source: Ambulatory Visit | Attending: Cardiology | Admitting: Cardiology

## 2012-12-02 ENCOUNTER — Encounter (HOSPITAL_COMMUNITY)
Admission: RE | Admit: 2012-12-02 | Discharge: 2012-12-02 | Disposition: A | Discharge status: Home / Self Care | Payer: Medicare Other | Source: Ambulatory Visit | Attending: Cardiology | Admitting: Cardiology

## 2012-12-04 ENCOUNTER — Encounter (HOSPITAL_COMMUNITY): Payer: Medicare Other

## 2012-12-07 ENCOUNTER — Encounter: Payer: Self-pay | Admitting: Cardiology

## 2012-12-07 ENCOUNTER — Encounter (HOSPITAL_COMMUNITY)
Admission: RE | Admit: 2012-12-07 | Discharge: 2012-12-07 | Disposition: A | Discharge status: Home / Self Care | Payer: Medicare Other | Source: Ambulatory Visit | Attending: Cardiology | Admitting: Cardiology

## 2012-12-09 ENCOUNTER — Encounter (HOSPITAL_COMMUNITY)
Admission: RE | Admit: 2012-12-09 | Discharge: 2012-12-09 | Disposition: A | Discharge status: Home / Self Care | Payer: Medicare Other | Source: Ambulatory Visit | Attending: Cardiology | Admitting: Cardiology

## 2012-12-11 ENCOUNTER — Encounter (HOSPITAL_COMMUNITY)
Admission: RE | Admit: 2012-12-11 | Discharge: 2012-12-11 | Disposition: A | Discharge status: Home / Self Care | Payer: Medicare Other | Source: Ambulatory Visit | Attending: Cardiology | Admitting: Cardiology

## 2012-12-14 ENCOUNTER — Encounter (HOSPITAL_COMMUNITY)
Admission: RE | Admit: 2012-12-14 | Discharge: 2012-12-14 | Disposition: A | Discharge status: Home / Self Care | Payer: Medicare Other | Source: Ambulatory Visit | Attending: Cardiology | Admitting: Cardiology

## 2012-12-16 ENCOUNTER — Encounter (HOSPITAL_COMMUNITY)
Admission: RE | Admit: 2012-12-16 | Discharge: 2012-12-16 | Disposition: A | Discharge status: Home / Self Care | Payer: Medicare Other | Source: Ambulatory Visit | Attending: Cardiology | Admitting: Cardiology

## 2012-12-18 ENCOUNTER — Encounter (HOSPITAL_COMMUNITY)
Admission: RE | Admit: 2012-12-18 | Discharge: 2012-12-18 | Disposition: A | Discharge status: Home / Self Care | Payer: Medicare Other | Source: Ambulatory Visit | Attending: Cardiology | Admitting: Cardiology

## 2012-12-21 ENCOUNTER — Encounter (HOSPITAL_COMMUNITY)
Admission: RE | Admit: 2012-12-21 | Discharge: 2012-12-21 | Disposition: A | Discharge status: Home / Self Care | Payer: Medicare Other | Source: Ambulatory Visit | Attending: Cardiology | Admitting: Cardiology

## 2012-12-21 NOTE — Progress Notes (Signed)
Benjamin Ramsey 72 y.o. male Nutrition Note Spoke with pt.  Nutrition Plan and Nutrition Survey goals reviewed with pt. Pt is working towards following Step 1 of the Therapeutic Lifestyle Changes diet. Pt wants to lose wt. Per pt, "I lost 45 lbs on Weight Watcher's 5 years ago and slowly gained it all back." Pt wt today 127.9 kg (281.4 lb). Pt has been trying to lose wt by "doing this (exercise) and moderation with eating." Wt loss tips reviewed.  Pt is diabetic. Last A1c indicates blood glucose not well-controlled. Per pt, A1c rechecked and was "7.4 or something." Pt checks CBG's "3 times a week after lunch before I come to exercise." Pt reports his CBG's are usually "in the 160's." This writer went over Diabetes Education test results. Pt expressed understanding of the information reviewed. Pt aware of nutrition education classes offered and plans on attending nutrition classes after he graduates from cardiac rehab due to scheduling conflict with his "1-day/week job."  Nutrition Diagnosis   Food-and nutrition-related knowledge deficit related to lack of exposure to information as related to diagnosis of: ? CVD ? DM (A1c 8.6)   Obesity related to excessive energy intake as evidenced by a BMI of 36.2  Nutrition RX/ Estimated Daily Nutrition Needs for: wt loss  2000-2200 Kcal, 55-60 gm fat, 13-15 gm sat fat, 1.9-2.2 gm trans-fat, <1500 mg sodium, 250-325 gm CHO   Nutrition Intervention   Pt's individual nutrition plan reviewed with pt.   Benefits of adopting Therapeutic Lifestyle Changes discussed when Medficts reviewed.   Pt to attend the Portion Distortion class   Pt to attend the  ? Nutrition I class                     ? Nutrition II class        ? Diabetes Blitz class       ? Diabetes Q & A class   Pt given handouts for: ? Tuesday Nutrition class schedule    Continue client-centered nutrition education by RD, as part of interdisciplinary care. Goal(s)   Pt to identify and limit food  sources of saturated fat, trans fat, and cholesterol   Pt to identify food quantities necessary to achieve: ? wt loss to a goal wt of 260-278 lb (118.3-126.5 kg) at graduation from cardiac rehab.    CBG concentrations in the normal range or as close to normal as is safely possible. Monitor and Evaluate progress toward nutrition goal with team. Nutrition Risk: Change to Moderate   Mickle Plumb, M.Ed, RD, LDN, CDE 12/21/2012 3:56 PM

## 2012-12-23 ENCOUNTER — Encounter (HOSPITAL_COMMUNITY)
Admission: RE | Admit: 2012-12-23 | Discharge: 2012-12-23 | Disposition: A | Discharge status: Home / Self Care | Payer: Medicare Other | Source: Ambulatory Visit | Attending: Cardiology | Admitting: Cardiology

## 2012-12-25 ENCOUNTER — Encounter (HOSPITAL_COMMUNITY): Payer: Medicare Other

## 2012-12-28 ENCOUNTER — Encounter (HOSPITAL_COMMUNITY): Payer: Medicare Other

## 2012-12-30 ENCOUNTER — Encounter (HOSPITAL_COMMUNITY)
Admission: RE | Admit: 2012-12-30 | Discharge: 2012-12-30 | Disposition: A | Discharge status: Home / Self Care | Payer: Medicare Other | Source: Ambulatory Visit | Attending: Cardiology | Admitting: Cardiology

## 2012-12-30 DIAGNOSIS — I2510 Atherosclerotic heart disease of native coronary artery without angina pectoris: Secondary | ICD-10-CM | POA: Insufficient documentation

## 2012-12-30 DIAGNOSIS — Z7982 Long term (current) use of aspirin: Secondary | ICD-10-CM | POA: Insufficient documentation

## 2012-12-30 DIAGNOSIS — I214 Non-ST elevation (NSTEMI) myocardial infarction: Secondary | ICD-10-CM | POA: Insufficient documentation

## 2012-12-30 DIAGNOSIS — I10 Essential (primary) hypertension: Secondary | ICD-10-CM | POA: Insufficient documentation

## 2012-12-30 DIAGNOSIS — Z042 Encounter for examination and observation following work accident: Secondary | ICD-10-CM | POA: Insufficient documentation

## 2012-12-30 DIAGNOSIS — Z5189 Encounter for other specified aftercare: Secondary | ICD-10-CM | POA: Insufficient documentation

## 2012-12-30 DIAGNOSIS — Z7902 Long term (current) use of antithrombotics/antiplatelets: Secondary | ICD-10-CM | POA: Insufficient documentation

## 2012-12-31 ENCOUNTER — Other Ambulatory Visit: Payer: Self-pay

## 2013-01-01 ENCOUNTER — Encounter (HOSPITAL_COMMUNITY)
Admission: RE | Admit: 2013-01-01 | Discharge: 2013-01-01 | Disposition: A | Discharge status: Home / Self Care | Payer: Medicare Other | Source: Ambulatory Visit | Attending: Cardiology | Admitting: Cardiology

## 2013-01-04 ENCOUNTER — Encounter (HOSPITAL_COMMUNITY): Payer: Medicare Other

## 2013-01-06 ENCOUNTER — Encounter (HOSPITAL_COMMUNITY): Payer: Medicare Other

## 2013-01-08 ENCOUNTER — Encounter (HOSPITAL_COMMUNITY): Payer: Medicare Other

## 2013-01-11 ENCOUNTER — Encounter (HOSPITAL_COMMUNITY): Payer: Medicare Other

## 2013-01-13 ENCOUNTER — Encounter (HOSPITAL_COMMUNITY)
Admission: RE | Admit: 2013-01-13 | Discharge: 2013-01-13 | Disposition: A | Discharge status: Home / Self Care | Payer: Medicare Other | Source: Ambulatory Visit | Attending: Cardiology | Admitting: Cardiology

## 2013-01-15 ENCOUNTER — Encounter (HOSPITAL_COMMUNITY)
Admission: RE | Admit: 2013-01-15 | Discharge: 2013-01-15 | Disposition: A | Discharge status: Home / Self Care | Payer: Medicare Other | Source: Ambulatory Visit | Attending: Cardiology | Admitting: Cardiology

## 2013-01-18 ENCOUNTER — Encounter (HOSPITAL_COMMUNITY)
Admission: RE | Admit: 2013-01-18 | Discharge: 2013-01-18 | Disposition: A | Discharge status: Home / Self Care | Payer: Medicare Other | Source: Ambulatory Visit | Attending: Cardiology | Admitting: Cardiology

## 2013-01-19 ENCOUNTER — Ambulatory Visit: Payer: Medicare Other | Admitting: Cardiology

## 2013-01-20 ENCOUNTER — Encounter (HOSPITAL_COMMUNITY)
Admission: RE | Admit: 2013-01-20 | Discharge: 2013-01-20 | Disposition: A | Discharge status: Home / Self Care | Payer: Medicare Other | Source: Ambulatory Visit | Attending: Cardiology | Admitting: Cardiology

## 2013-01-20 NOTE — Progress Notes (Signed)
Benjamin Ramsey graduates today and plans to continue exercise via  treadmill at home.

## 2013-03-08 ENCOUNTER — Other Ambulatory Visit (HOSPITAL_COMMUNITY): Payer: Self-pay | Admitting: Nurse Practitioner

## 2013-03-10 ENCOUNTER — Other Ambulatory Visit: Payer: Self-pay

## 2013-03-10 MED ORDER — METOPROLOL TARTRATE 25 MG PO TABS: 25.0000 mg | ORAL_TABLET | Freq: Two times a day (BID) | ORAL | Status: DC

## 2013-03-16 ENCOUNTER — Encounter: Payer: Self-pay | Admitting: Cardiology

## 2013-04-06 ENCOUNTER — Ambulatory Visit: Payer: Medicare Other | Admitting: Cardiology

## 2013-04-06 ENCOUNTER — Other Ambulatory Visit (HOSPITAL_COMMUNITY): Payer: Self-pay | Admitting: Cardiology

## 2013-04-13 ENCOUNTER — Other Ambulatory Visit: Payer: Self-pay

## 2013-04-13 MED ORDER — TICAGRELOR 90 MG PO TABS: 90.0000 mg | ORAL_TABLET | Freq: Two times a day (BID) | ORAL | Status: DC

## 2013-04-15 ENCOUNTER — Ambulatory Visit (INDEPENDENT_AMBULATORY_CARE_PROVIDER_SITE_OTHER): Payer: Medicare Other | Admitting: Cardiology

## 2013-04-15 ENCOUNTER — Encounter: Payer: Self-pay | Admitting: Cardiology

## 2013-04-15 VITALS — BP 128/78 | HR 79 | Ht 74.4 in | Wt 279.4 lb

## 2013-04-15 DIAGNOSIS — I2510 Atherosclerotic heart disease of native coronary artery without angina pectoris: Secondary | ICD-10-CM

## 2013-04-15 NOTE — Progress Notes (Signed)
HPI The patient is being seen for follow up after a hospitalization for NSTEMI. He underwent cardiac catheterization on June 9, revealing significant proximal LAD disease and otherwise nonobstructive CAD with normal LV function. The LAD was successfully stented using a 3.0 x 24 mm Promus Premier drug-eluting stent.   Since I last saw him he  Has had some fatigue and mild dyspnea. He feels this when he has to be active. He is unfortunately not walking as much as I would like very he really thinks this is related to his medications and wonders if this could be his Lipitor or his beta blocker. He is not having any of the severe symptoms  At the time of his heart attack. He's not having any new chest pressure, neck or arm discomfort. He's not having any PND or orthopnea.  No Known Allergies  Current Outpatient Prescriptions  Medication Sig Dispense Refill  . aspirin EC 81 MG tablet Take 81 mg by mouth daily.      Marland Kitchen. atorvastatin (LIPITOR) 80 MG tablet TAKE 1 TABLET (80 MG TOTAL) BY MOUTH DAILY AT 6 PM.  30 tablet  0  . fish oil-omega-3 fatty acids 1000 MG capsule Take 1 g by mouth See admin instructions. Several times a week      . glyBURIDE-metformin (GLUCOVANCE) 2.5-500 MG per tablet Take 2 tablets by mouth 2 (two) times daily with a meal.      . levothyroxine (SYNTHROID, LEVOTHROID) 112 MCG tablet Take 112 mcg by mouth daily before breakfast.      . Liraglutide (VICTOZA [place]) Inject into the skin.      . metoprolol tartrate (LOPRESSOR) 25 MG tablet Take 1 tablet (25 mg total) by mouth 2 (two) times daily.  60 tablet  1  . nitroGLYCERIN (NITROSTAT) 0.4 MG SL tablet Place 1 tablet (0.4 mg total) under the tongue every 5 (five) minutes x 3 doses as needed for chest pain.  25 tablet  3  . omeprazole (PRILOSEC OTC) 20 MG tablet Take 20 mg by mouth daily before supper.      . Ticagrelor (BRILINTA) 90 MG TABS tablet Take 1 tablet (90 mg total) by mouth 2 (two) times daily.  60 tablet  0   No current  facility-administered medications for this visit.    Past Medical History  Diagnosis Date  . Diabetes mellitus without complication   . Hypothyroidism   . Coronary artery disease     s/p NSTEMI, PCI to LAD June 2014  . Hyperlipidemia     Past Surgical History  Procedure Laterality Date  . Left biceps tendon repair    . Knee cartilage surgery Bilateral   . Inguinal hernia repair Left     ROS:  As stated in the HPI and negative for all other systems.  PHYSICAL EXAM BP 128/78  Pulse 79  Ht 6' 2.4" (1.89 m)  Wt 279 lb 6.4 oz (126.735 kg)  BMI 35.48 kg/m2 GENERAL:  Well appearing NECK:  No jugular venous distention, waveform within normal limits, carotid upstroke brisk and symmetric, no bruits, no thyromegaly LUNGS:  Clear to auscultation bilaterally BACK:  No CVA tenderness CHEST:  Unremarkable HEART:  PMI not displaced or sustained,S1 and S2 within normal limits, no S3, no S4, no clicks, no rubs, no murmurs ABD:  Flat, positive bowel sounds normal in frequency in pitch, no bruits, no rebound, no guarding, no midline pulsatile mass, no hepatomegaly, no splenomegaly EXT:  2 plus pulses throughout, no edema, no  cyanosis no clubbing  EKG:  Sinus rhythm, rate 79, axis within normal limits, intervals within normal limits, no acute ST-T wave changes.  04/15/2013   ASSESSMENT AND PLAN  CAD:  The patient has no new sypmtoms.  No further cardiovascular testing is indicated.  We will continue with aggressive risk reduction and meds as listed.  I will see him back in June at which point I will most likely stop the Brilinta  DYSLIPIDEMIA:   We had a long discussion about this. As he might be having some symptoms with a statin I am going to have him reduce to 40 mg daily. I might hold it altogether to see if his symptoms improved. We had a long discussion about the plus minus of statin therapy which is clearly indicated.  OBESITY:   The patient understands the need to lose weight with diet  and exercise. We have discussed specific strategies for this.  DYPSNEA:   This is mild but worrisome to the patient that he thinks related to medications. This will be addressed as above.

## 2013-04-15 NOTE — Patient Instructions (Addendum)
The current medical regimen is effective;  continue present plan and medications. (Lipitor 40 mg)  Follow up in June 2015 with Dr Antoine PocheHochrein.

## 2013-05-02 ENCOUNTER — Other Ambulatory Visit: Payer: Self-pay | Admitting: Cardiology

## 2013-05-04 ENCOUNTER — Encounter: Payer: Self-pay | Admitting: Cardiology

## 2013-05-21 ENCOUNTER — Other Ambulatory Visit: Payer: Self-pay | Admitting: Cardiovascular Disease

## 2013-05-21 ENCOUNTER — Other Ambulatory Visit: Payer: Self-pay | Admitting: *Deleted

## 2013-05-26 ENCOUNTER — Other Ambulatory Visit: Payer: Self-pay

## 2013-05-26 MED ORDER — ATORVASTATIN CALCIUM 80 MG PO TABS: ORAL_TABLET | ORAL | Status: DC

## 2013-06-27 ENCOUNTER — Other Ambulatory Visit: Payer: Self-pay | Admitting: Cardiovascular Disease

## 2013-07-14 ENCOUNTER — Other Ambulatory Visit: Payer: Self-pay

## 2013-07-14 MED ORDER — METOPROLOL TARTRATE 25 MG PO TABS: ORAL_TABLET | ORAL | Status: DC

## 2013-08-02 ENCOUNTER — Ambulatory Visit (INDEPENDENT_AMBULATORY_CARE_PROVIDER_SITE_OTHER): Payer: Medicare Other | Admitting: Cardiology

## 2013-08-02 ENCOUNTER — Encounter: Payer: Self-pay | Admitting: Cardiology

## 2013-08-02 VITALS — BP 114/68 | HR 78 | Ht 74.0 in | Wt 282.4 lb

## 2013-08-02 DIAGNOSIS — I2510 Atherosclerotic heart disease of native coronary artery without angina pectoris: Secondary | ICD-10-CM

## 2013-08-02 DIAGNOSIS — I214 Non-ST elevation (NSTEMI) myocardial infarction: Secondary | ICD-10-CM

## 2013-08-02 MED ORDER — TICAGRELOR 90 MG PO TABS: ORAL_TABLET | ORAL | Status: DC

## 2013-08-02 MED ORDER — ATORVASTATIN CALCIUM 40 MG PO TABS: 40.0000 mg | ORAL_TABLET | Freq: Every day | ORAL | Status: DC

## 2013-08-02 NOTE — Patient Instructions (Addendum)
Your physician recommends that you continue on your current medications as directed. Please refer to the Current Medication list given to you today.  Your physician wants you to follow-up in: 6 months with Dr. Hoyle Barr will receive a reminder letter in the mail two months in advance. If you don't receive a letter, please call our office to schedule the follow-up appointment.   Your physician recommends that you return for lab work tomorrow for fasting cholesterol & lfts

## 2013-08-02 NOTE — Progress Notes (Signed)
HPI The patient is being seen for follow up after NSTEMI  on August 03 2012.  This revealed significant proximal LAD disease and otherwise nonobstructive CAD with normal LV function. The LAD was successfully stented using a 3.0 x 24 mm Promus Premier drug-eluting stent.   The patient denies any new symptoms such as chest discomfort, neck or arm discomfort. There has been no new shortness of breath, PND or orthopnea. There have been no reported palpitations, presyncope or syncope.  He says that he did have increased fatigue with 80 mg of Lipitor but it is much better since he reduced to 40 mg.  He is not doing the exercise that he needs to do.    No Known Allergies  Current Outpatient Prescriptions  Medication Sig Dispense Refill  . aspirin EC 81 MG tablet Take 81 mg by mouth daily.      Marland Kitchen atorvastatin (LIPITOR) 80 MG tablet Take 40 mg by mouth daily at 6 PM. TAKE 1 TABLET (80 MG TOTAL) BY MOUTH DAILY AT 6 PM.      . BRILINTA 90 MG TABS tablet TAKE 1 TABLET (90 MG TOTAL) BY MOUTH 2 (TWO) TIMES DAILY.  60 tablet  0  . glyBURIDE-metformin (GLUCOVANCE) 2.5-500 MG per tablet Take 2 tablets by mouth 2 (two) times daily with a meal.      . levothyroxine (SYNTHROID, LEVOTHROID) 112 MCG tablet Take 125 mcg by mouth daily before breakfast.       . Liraglutide (VICTOZA [place]) Inject into the skin.      . metoprolol tartrate (LOPRESSOR) 25 MG tablet TAKE 1 TABLET (25 MG TOTAL) BY MOUTH 2 (TWO) TIMES DAILY.  90 tablet  1  . nitroGLYCERIN (NITROSTAT) 0.4 MG SL tablet Place 1 tablet (0.4 mg total) under the tongue every 5 (five) minutes x 3 doses as needed for chest pain.  25 tablet  3  . omeprazole (PRILOSEC OTC) 20 MG tablet Take 20 mg by mouth daily before supper.       No current facility-administered medications for this visit.    Past Medical History  Diagnosis Date  . Diabetes mellitus without complication   . Hypothyroidism   . Coronary artery disease     s/p NSTEMI, PCI to LAD June 2014  .  Hyperlipidemia     Past Surgical History  Procedure Laterality Date  . Left biceps tendon repair    . Knee cartilage surgery Bilateral   . Inguinal hernia repair Left     ROS:  As stated in the HPI and negative for all other systems.  PHYSICAL EXAM BP 114/68  Pulse 78  Ht 6\' 2"  (1.88 m)  Wt 282 lb 6.4 oz (128.096 kg)  BMI 36.24 kg/m2 GENERAL:  Well appearing NECK:  No jugular venous distention, waveform within normal limits, carotid upstroke brisk and symmetric, no bruits, no thyromegaly LUNGS:  Clear to auscultation bilaterally BACK:  No CVA tenderness CHEST:  Unremarkable HEART:  PMI not displaced or sustained,S1 and S2 within normal limits, no S3, no S4, no clicks, no rubs, no murmurs ABD:  Flat, positive bowel sounds normal in frequency in pitch, no bruits, no rebound, no guarding, no midline pulsatile mass, no hepatomegaly, no splenomegaly EXT:  2 plus pulses throughout, no edema, no cyanosis no clubbing  EKG:  Sinus rhythm, rate 79, axis within normal limits, intervals within normal limits, no acute ST-T wave changes.  08/02/2013   ASSESSMENT AND PLAN  CAD:  The patient has no new  sypmtoms.  No further cardiovascular testing is indicated.  We will continue with aggressive risk reduction and meds as listed.  We had a long discussion about the benefit and timing of DAPT.  Given the fact that this was a proximal LAD lesion and large territory at risk I will continue with DAPT for now but reassess at the next appt.    DYSLIPIDEMIA:   I will check a lipid profile and liver enzymes.   OBESITY:   The patient understands the need to lose weight with diet and exercise. We have discussed specific strategies for this.

## 2013-08-03 ENCOUNTER — Other Ambulatory Visit (INDEPENDENT_AMBULATORY_CARE_PROVIDER_SITE_OTHER): Payer: Medicare Other

## 2013-08-03 DIAGNOSIS — I2510 Atherosclerotic heart disease of native coronary artery without angina pectoris: Secondary | ICD-10-CM

## 2013-08-03 LAB — HEPATIC FUNCTION PANEL
ALBUMIN: 3.8 g/dL (ref 3.5–5.2)
ALT: 20 U/L (ref 0–53)
AST: 22 U/L (ref 0–37)
Alkaline Phosphatase: 67 U/L (ref 39–117)
Bilirubin, Direct: 0.2 mg/dL (ref 0.0–0.3)
TOTAL PROTEIN: 6.6 g/dL (ref 6.0–8.3)
Total Bilirubin: 0.5 mg/dL (ref 0.2–1.2)

## 2013-08-03 LAB — LIPID PANEL
CHOLESTEROL: 112 mg/dL (ref 0–200)
HDL: 44.2 mg/dL (ref >39.00)
LDL Cholesterol: 54 mg/dL (ref 0–99)
NonHDL: 67.8
Total CHOL/HDL Ratio: 3
Triglycerides: 67 mg/dL (ref 0.0–149.0)
VLDL: 13.4 mg/dL (ref 0.0–40.0)

## 2013-10-20 ENCOUNTER — Other Ambulatory Visit: Payer: Self-pay

## 2013-10-20 IMAGING — CR DG CHEST 1V PORT
1 series · 1 of 1 positions shown · non-contrast
Comparison: No priors.

CLINICAL DATA: Chest pain.  Shortness of breath.  Weakness.

PORTABLE CHEST - 1 VIEW

[AP]
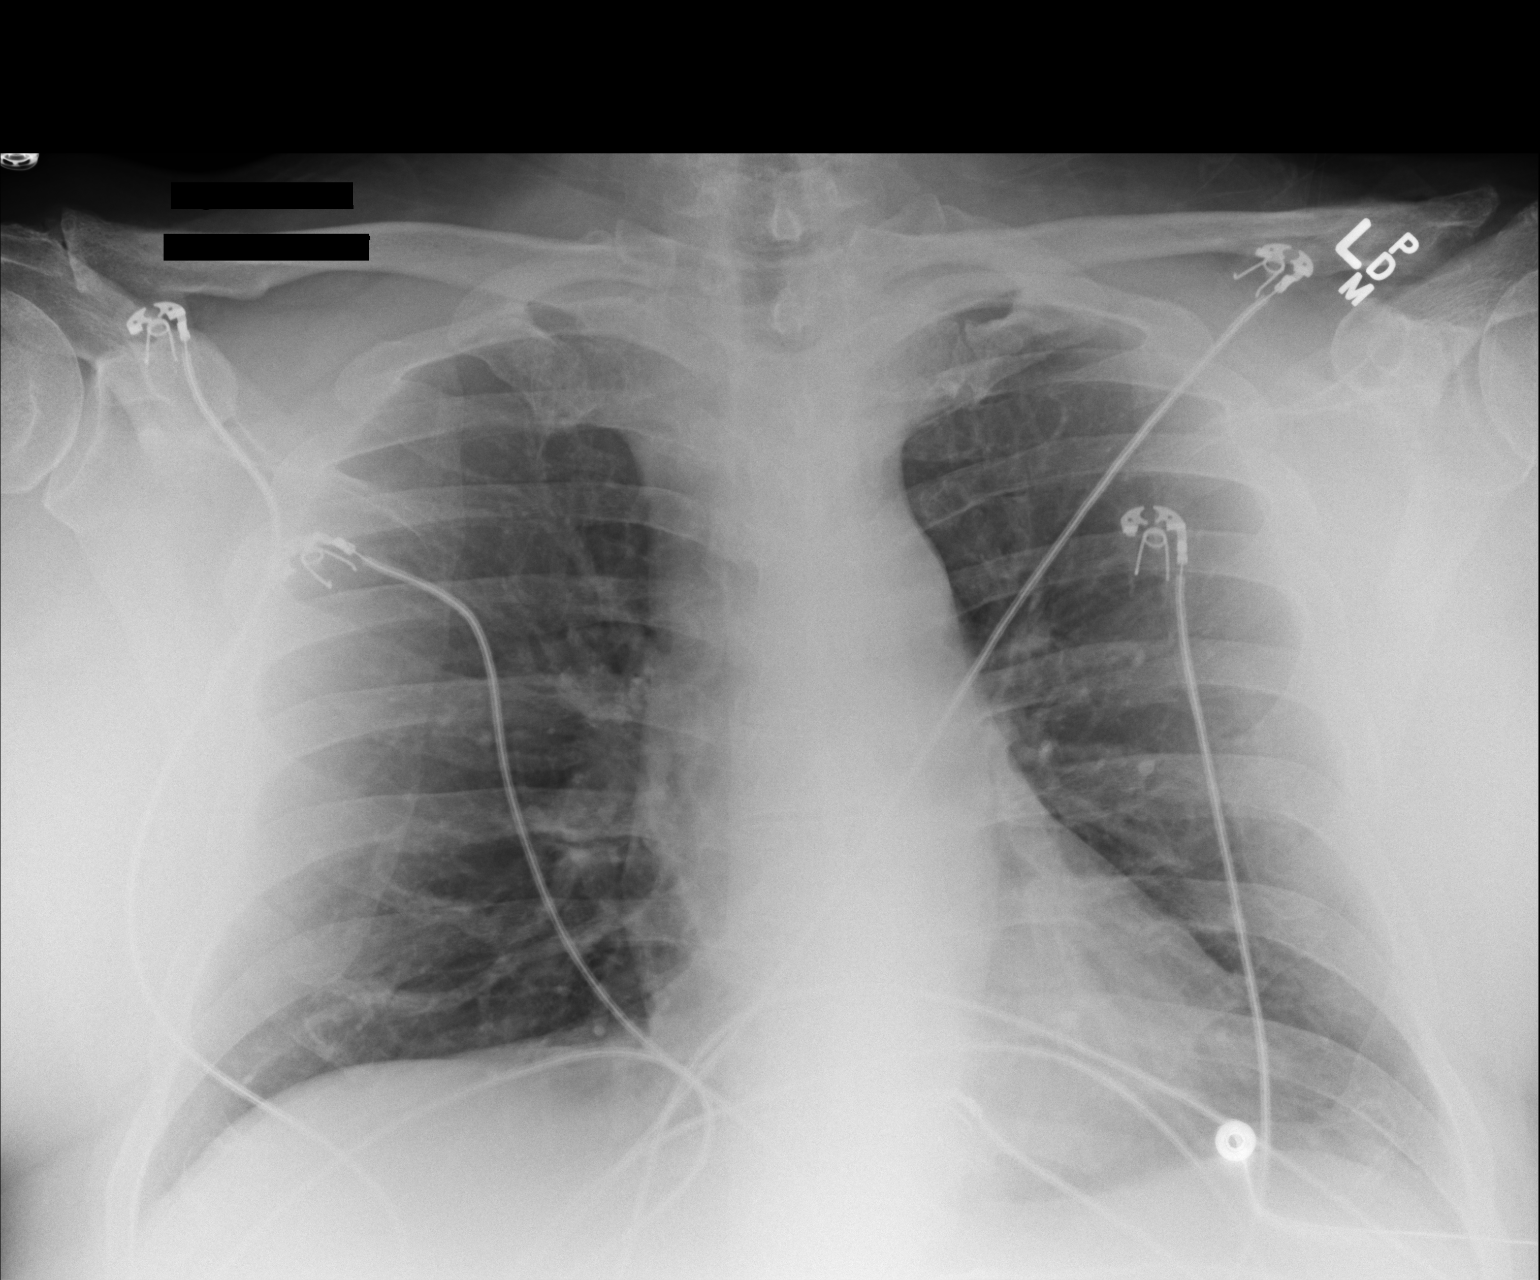

[1 of 1 positions shown; findings below may reference images not displayed]

FINDINGS: Lung volumes are normal.  No consolidative airspace
disease.  No pleural effusions.  No pneumothorax.  No pulmonary
nodule or mass noted.  Pulmonary vasculature and the
cardiomediastinal silhouette are within normal limits.
IMPRESSION: 1. No radiographic evidence of acute cardiopulmonary disease.

## 2013-10-20 MED ORDER — METOPROLOL TARTRATE 25 MG PO TABS: ORAL_TABLET | ORAL | Status: DC

## 2014-02-01 ENCOUNTER — Encounter: Payer: Self-pay | Admitting: Cardiology

## 2014-02-01 ENCOUNTER — Telehealth: Payer: Self-pay | Admitting: Cardiology

## 2014-02-01 ENCOUNTER — Other Ambulatory Visit: Payer: Self-pay | Admitting: *Deleted

## 2014-02-01 ENCOUNTER — Ambulatory Visit (INDEPENDENT_AMBULATORY_CARE_PROVIDER_SITE_OTHER): Payer: Medicare Other | Admitting: Cardiology

## 2014-02-01 VITALS — BP 152/88 | HR 93 | Ht 74.0 in | Wt 291.0 lb

## 2014-02-01 DIAGNOSIS — I2510 Atherosclerotic heart disease of native coronary artery without angina pectoris: Secondary | ICD-10-CM

## 2014-02-01 DIAGNOSIS — R0602 Shortness of breath: Secondary | ICD-10-CM

## 2014-02-01 MED ORDER — METOPROLOL TARTRATE 25 MG PO TABS: ORAL_TABLET | ORAL | Status: DC

## 2014-02-01 MED ORDER — DULAGLUTIDE 0.75 MG/0.5ML ~~LOC~~ SOAJ: 0.7500 mg | SUBCUTANEOUS | Status: AC

## 2014-02-01 NOTE — Patient Instructions (Signed)
Your physician recommends that you schedule a follow-up appointment in: 2 months with Dr. Antoine PocheHochrein  We are ordering a stress test  Stop taking your Brilinta

## 2014-02-01 NOTE — Telephone Encounter (Signed)
I have spoken to this pt and we will see him on Friday

## 2014-02-01 NOTE — Progress Notes (Signed)
HPI The patient is being seen for follow up after NSTEMI  on August 03 2012.  This revealed significant proximal LAD disease and otherwise nonobstructive CAD with normal LV function. The LAD was successfully stented using a 3.0 x 24 mm Promus Premier drug-eluting stent.    Unfortunately the last couple of months he's had some increased dyspnea with activities. He says he gets to the top of the stairs and  And he has to take 30-60 seconds to catch his breath. He might be short of breath when bending over to tie his shoes. He has gained some weight from eating too much. He says his dyspnea is not the same as his previous angina. The patient denies any chest discomfort, neck or arm discomfort. There has been no new PND or orthopnea. There have been no reported palpitations, presyncope or syncope.  No Known Allergies  Current Outpatient Prescriptions  Medication Sig Dispense Refill  . aspirin EC 81 MG tablet Take 81 mg by mouth daily.    Marland Kitchen. atorvastatin (LIPITOR) 40 MG tablet Take 1 tablet (40 mg total) by mouth daily at 6 PM. TAKE 1 TABLET (80 MG TOTAL) BY MOUTH DAILY AT 6 PM.    . Dulaglutide (TRULICITY) 0.75 MG/0.5ML SOPN Inject 0.75 mg into the skin once a week. 4 mL 3  . glyBURIDE-metformin (GLUCOVANCE) 2.5-500 MG per tablet Take 2 tablets by mouth 2 (two) times daily with a meal.    . levothyroxine (SYNTHROID, LEVOTHROID) 112 MCG tablet Take 125 mcg by mouth daily before breakfast.     . Liraglutide (VICTOZA [place]) Inject into the skin.    . metoprolol tartrate (LOPRESSOR) 25 MG tablet TAKE 1 TABLET (25 MG TOTAL) BY MOUTH 2 (TWO) TIMES DAILY. 90 tablet 1  . nitroGLYCERIN (NITROSTAT) 0.4 MG SL tablet Place 1 tablet (0.4 mg total) under the tongue every 5 (five) minutes x 3 doses as needed for chest pain. 25 tablet 3  . omeprazole (PRILOSEC OTC) 20 MG tablet Take 20 mg by mouth daily before supper.    . ticagrelor (BRILINTA) 90 MG TABS tablet TAKE 1 TABLET (90 MG TOTAL) BY MOUTH 2 (TWO) TIMES DAILY.  60 tablet 11   No current facility-administered medications for this visit.    Past Medical History  Diagnosis Date  . Diabetes mellitus without complication   . Hypothyroidism   . Coronary artery disease     s/p NSTEMI, PCI to LAD June 2014  . Hyperlipidemia     Past Surgical History  Procedure Laterality Date  . Left biceps tendon repair    . Knee cartilage surgery Bilateral   . Inguinal hernia repair Left     ROS:  As stated in the HPI and negative for all other systems.  PHYSICAL EXAM BP 152/88 mmHg  Pulse 93  Ht 6\' 2"  (1.88 m)  Wt 291 lb (131.997 kg)  BMI 37.35 kg/m2 GENERAL:  Well appearing NECK:  No jugular venous distention, waveform within normal limits, carotid upstroke brisk and symmetric, no bruits, no thyromegaly LUNGS:  Clear to auscultation bilaterally BACK:  No CVA tenderness CHEST:  Unremarkable HEART:  PMI not displaced or sustained,S1 and S2 within normal limits, no S3, no S4, no clicks, no rubs, no murmurs ABD:  Flat, positive bowel sounds normal in frequency in pitch, no bruits, no rebound, no guarding, no midline pulsatile mass, no hepatomegaly, no splenomegaly EXT:  2 plus pulses throughout, no edema, no cyanosis no clubbing  EKG:  Sinus rhythm, rate  79, axis within normal limits, intervals within normal limits, no acute ST-T wave changes.  02/01/2014   ASSESSMENT AND PLAN  CAD:   The patient can stop his Brilinta  which might be contributing to his dypnea.   I don't strongly suspect obstructive coronary disease but I will bring him back for a POET (Plain Old Exercise Treadmill).    OBESITY:   The patient understands the need to lose weight with diet and exercise. We have discussed specific strategies for this.  He has actually gained weight.    DYSLIPIDEMIA:  His lipids are as below.  He will remain on the meds as listed.   Lab Results  Component Value Date   CHOL 112 08/03/2013   TRIG 67.0 08/03/2013   HDL 44.20 08/03/2013   LDLCALC 54  08/03/2013   HTN:  This is an unusual reading for him.  He will continue the meds as listed.

## 2014-02-01 NOTE — Telephone Encounter (Signed)
Please call,wants to know whether he needs an appt? He also said he wanted to know about continuing taking the medicine.

## 2014-02-03 ENCOUNTER — Encounter (HOSPITAL_COMMUNITY): Payer: Self-pay | Admitting: Cardiovascular Disease

## 2014-02-04 ENCOUNTER — Ambulatory Visit: Payer: Medicare Other | Admitting: Cardiology

## 2014-02-09 ENCOUNTER — Encounter (HOSPITAL_COMMUNITY): Payer: Medicare Other

## 2014-02-10 ENCOUNTER — Encounter (HOSPITAL_COMMUNITY): Payer: Medicare Other

## 2014-02-15 ENCOUNTER — Telehealth (HOSPITAL_COMMUNITY): Payer: Self-pay

## 2014-02-15 NOTE — Telephone Encounter (Signed)
Encounter complete. 

## 2014-02-22 ENCOUNTER — Ambulatory Visit (HOSPITAL_COMMUNITY)
Admission: RE | Admit: 2014-02-22 | Discharge: 2014-02-22 | Disposition: A | Discharge status: Home / Self Care | Payer: Medicare Other | Source: Ambulatory Visit | Attending: Cardiology | Admitting: Cardiology

## 2014-02-22 DIAGNOSIS — R0602 Shortness of breath: Secondary | ICD-10-CM

## 2014-02-22 NOTE — Procedures (Signed)
Exercise Treadmill Test  Pre-Exercise Testing Evaluation Rhythm: normal sinus                  Test  Exercise Tolerance Test Ordering MD: Angelina SheriffJake Hochrein, MD  Interpreting MD:   Unique Test No: 1 Treadmill:  1  Indication for ETT: exertional dyspnea  Contraindication to ETT: No   Stress Modality: exercise - treadmill  Cardiac Imaging Performed: non   Protocol: standard Bruce - maximal  Max BP:  161/68  Max MPHR (bpm):  148 85% MPR (bpm): 126  MPHR obtained (bpm):  139 % MPHR obtained:  93  Reached 85% MPHR (min:sec): 3:45 Total Exercise Time (min-sec): 4:16  Workload in METS: 6.10 Borg Scale:   Reason ETT Terminated:  patient's desire to stop due to DOE.    ST Segment Analysis At Rest: normal ST segments - no evidence of significant ST depression With Exercise: no evidence of significant ST depression  Other Information Arrhythmia:  No Angina during ETT:  absent (0) Quality of ETT:  diagnostic  ETT Interpretation:  normal - no evidence of ischemia by ST analysis  Comments: Nl GXT  Recommendations: Follow up with Dr. Antoine PocheHochrein

## 2014-04-13 ENCOUNTER — Ambulatory Visit (INDEPENDENT_AMBULATORY_CARE_PROVIDER_SITE_OTHER): Payer: Medicare Other | Admitting: Cardiology

## 2014-04-13 ENCOUNTER — Encounter: Payer: Self-pay | Admitting: Cardiology

## 2014-04-13 VITALS — BP 110/70 | HR 73 | Ht 74.0 in | Wt 288.6 lb

## 2014-04-13 DIAGNOSIS — I2510 Atherosclerotic heart disease of native coronary artery without angina pectoris: Secondary | ICD-10-CM

## 2014-04-13 NOTE — Patient Instructions (Signed)
Your physician wants you to follow-up in: 1 Year. You will receive a reminder letter in the mail two months in advance. If you don't receive a letter, please call our office to schedule the follow-up appointment.  

## 2014-04-13 NOTE — Progress Notes (Signed)
HPI The patient is being seen for follow up after NSTEMI  on August 03, 2012.  He had proximal LAD disease and otherwise nonobstructive CAD with normal LV function. The LAD was successfully stented using a 3.0 x 24 mm Promus Premier drug-eluting stent.  At the last visit he was having increasing dyspnea with activities.  I stopped his Brilinta.  He had a negative POET (Plain Old Exercise Treadmill).  Since that time he says that he feels better.  He is having less dyspnea.  The patient denies any new symptoms such as chest discomfort, neck or arm discomfort. There has been no new PND or orthopnea. There have been no reported palpitations, presyncope or syncope.  No Known Allergies  Current Outpatient Prescriptions  Medication Sig Dispense Refill  . aspirin EC 81 MG tablet Take 81 mg by mouth daily.    Marland Kitchen. atorvastatin (LIPITOR) 40 MG tablet Take 1 tablet (40 mg total) by mouth daily at 6 PM. TAKE 1 TABLET (80 MG TOTAL) BY MOUTH DAILY AT 6 PM.    . Dulaglutide (TRULICITY) 0.75 MG/0.5ML SOPN Inject 0.75 mg into the skin once a week. 4 mL 3  . glyBURIDE-metformin (GLUCOVANCE) 2.5-500 MG per tablet Take 2 tablets by mouth 2 (two) times daily with a meal.    . levothyroxine (SYNTHROID, LEVOTHROID) 112 MCG tablet Take 125 mcg by mouth daily before breakfast.     . metoprolol tartrate (LOPRESSOR) 25 MG tablet TAKE 1 TABLET (25 MG TOTAL) BY MOUTH 2 (TWO) TIMES DAILY. 90 tablet 1  . nitroGLYCERIN (NITROSTAT) 0.4 MG SL tablet Place 1 tablet (0.4 mg total) under the tongue every 5 (five) minutes x 3 doses as needed for chest pain. 25 tablet 3  . omeprazole (PRILOSEC OTC) 20 MG tablet Take 20 mg by mouth daily before supper.     No current facility-administered medications for this visit.    Past Medical History  Diagnosis Date  . Diabetes mellitus without complication   . Hypothyroidism   . Coronary artery disease     s/p NSTEMI, PCI to LAD June 2014  . Hyperlipidemia     Past Surgical History    Procedure Laterality Date  . Left biceps tendon repair    . Knee cartilage surgery Bilateral   . Inguinal hernia repair Left   . Left heart catheterization with coronary angiogram N/A 08/03/2012    Procedure: LEFT HEART CATHETERIZATION WITH CORONARY ANGIOGRAM;  Surgeon: Tonny BollmanMichael Cooper, MD;  [facility];  Service: Cardiovascular;  Laterality: N/A;    ROS:  As stated in the HPI and negative for all other systems.  PHYSICAL EXAM BP 110/70 mmHg  Pulse 73  Ht 6\' 2"  (1.88 m)  Wt 288 lb 9.6 oz (130.908 kg)  BMI 37.04 kg/m2 GENERAL:  Well appearing NECK:  No jugular venous distention, waveform within normal limits, carotid upstroke brisk and symmetric, no bruits, no thyromegaly LUNGS:  Clear to auscultation bilaterally HEART:  PMI not displaced or sustained,S1 and S2 within normal limits, no S3, no S4, no clicks, no rubs, no murmurs ABD:  Flat, positive bowel sounds normal in frequency in pitch, no bruits, no rebound, no guarding, no midline pulsatile mass, no hepatomegaly, no splenomegaly EXT:  2 plus pulses throughout, no edema, no cyanosis no clubbing   ASSESSMENT AND PLAN  CAD:   He had a negative stress test. He's feeling better. No further change in therapy is indicated.   OBESITY:   The patient understands the need  to lose weight with diet and exercise. We have discussed specific strategies for this again.    HTN:  The blood pressure is at target. No change in medications is indicated. We will continue with therapeutic lifestyle changes (TLC).

## 2014-05-09 ENCOUNTER — Other Ambulatory Visit: Payer: Self-pay | Admitting: Cardiology

## 2014-05-09 NOTE — Telephone Encounter (Signed)
Rx refill sent to patient pharmacy   

## 2014-05-30 ENCOUNTER — Other Ambulatory Visit: Payer: Self-pay | Admitting: Cardiology

## 2014-08-22 ENCOUNTER — Other Ambulatory Visit: Payer: Self-pay

## 2014-10-30 ENCOUNTER — Other Ambulatory Visit: Payer: Self-pay | Admitting: Cardiology

## 2014-11-02 ENCOUNTER — Other Ambulatory Visit: Payer: Self-pay | Admitting: *Deleted

## 2014-11-03 ENCOUNTER — Other Ambulatory Visit: Payer: Self-pay | Admitting: *Deleted

## 2015-02-02 ENCOUNTER — Telehealth: Payer: Self-pay | Admitting: Cardiology

## 2015-02-06 NOTE — Telephone Encounter (Signed)
Close encounter 

## 2015-04-05 ENCOUNTER — Encounter: Payer: Self-pay | Admitting: Cardiology

## 2015-04-13 ENCOUNTER — Encounter: Payer: Self-pay | Admitting: Cardiology

## 2015-04-13 ENCOUNTER — Ambulatory Visit (INDEPENDENT_AMBULATORY_CARE_PROVIDER_SITE_OTHER): Payer: Medicare Other | Admitting: Cardiology

## 2015-04-13 VITALS — BP 160/98 | HR 80 | Ht 74.0 in | Wt 291.3 lb

## 2015-04-13 DIAGNOSIS — R600 Localized edema: Secondary | ICD-10-CM

## 2015-04-13 DIAGNOSIS — I2510 Atherosclerotic heart disease of native coronary artery without angina pectoris: Secondary | ICD-10-CM

## 2015-04-13 MED ORDER — ATORVASTATIN CALCIUM 40 MG PO TABS: 40.0000 mg | ORAL_TABLET | Freq: Every day | ORAL | Status: DC

## 2015-04-13 MED ORDER — CHLORTHALIDONE 25 MG PO TABS: 25.0000 mg | ORAL_TABLET | Freq: Every day | ORAL | Status: DC

## 2015-04-13 NOTE — Patient Instructions (Signed)
Medication Instructions: Dr Antoine Poche has recommended making the following medication changes: DECREASE Atorvastatin as discussed START Chlorthalidone 25 mg - take 1 tablet by mouth daily  >>A new prescription has been sent to your pharmacy electronically  Labwork: NONE  Testing/Procedures: 1. Echocardiogram - Your physician has requested that you have an echocardiogram. Echocardiography is a painless test that uses sound waves to create images of your heart. It provides your doctor with information about the size and shape of your heart and how well your heart's chambers and valves are working. This procedure takes approximately one hour. There are no restrictions for this procedure.  Follow-up: Dr Antoine Poche recommends that you schedule a follow-up appointment in 3 months.  If you need a refill on your cardiac medications before your next appointment, please call your pharmacy.

## 2015-04-13 NOTE — Progress Notes (Signed)
HPI The patient is being seen for follow up after NSTEMI  on August 03, 2012.  He had proximal LAD disease and otherwise nonobstructive CAD with normal LV function. The LAD was successfully stented using a 3.0 x 24 mm Promus Premier drug-eluting stent.    Since I last saw him he was diagnosed with transitional cell cancer of the kidney.  He has been treated with Gemzar.  Remarkably he has had some remission.  He does have rising blood pressures and I reviewed his readings.  He also has had increased edema.  He does have fatigue and some dyspnea.  However, he patient denies any new symptoms such as chest discomfort, neck or arm discomfort. There has been no new PND or orthopnea. There have been no reported palpitations, presyncope or syncope.  No Known Allergies  Current Outpatient Prescriptions  Medication Sig Dispense Refill  . aspirin EC 81 MG tablet Take 81 mg by mouth daily.    Marland Kitchen atorvastatin (LIPITOR) 80 MG tablet TAKE 1 TABLET (80 MG TOTAL) BY MOUTH DAILY AT 6 PM. 30 tablet 4  . Coenzyme Q10 (COQ10 PO) Take 1 tablet by mouth daily.    . Dulaglutide (TRULICITY) 0.75 MG/0.5ML SOPN Inject 0.75 mg into the skin once a week. 4 mL 3  . Ferrous Sulfate (IRON) 325 (65 Fe) MG TABS Take 1 tablet by mouth daily.    Marland Kitchen glyBURIDE-metformin (GLUCOVANCE) 2.5-500 MG per tablet Take 2 tablets by mouth 2 (two) times daily with a meal.    . glyBURIDE-metformin (GLUCOVANCE) 5-500 MG tablet Take 1 tablet by mouth 2 (two) times daily. Take 1 tab by mouth twice a day with meals  5  . levothyroxine (SYNTHROID, LEVOTHROID) 112 MCG tablet Take 125 mcg by mouth daily before breakfast.     . metoprolol tartrate (LOPRESSOR) 25 MG tablet TAKE 1 TABLET (25 MG TOTAL) BY MOUTH 2 (TWO) TIMES DAILY. 90 tablet 3  . nitroGLYCERIN (NITROSTAT) 0.4 MG SL tablet Place 1 tablet (0.4 mg total) under the tongue every 5 (five) minutes x 3 doses as needed for chest pain. 25 tablet 3  . NON FORMULARY CHEMO INFUSION    . omeprazole  (PRILOSEC OTC) 20 MG tablet Take 20 mg by mouth daily before supper.     No current facility-administered medications for this visit.    Past Medical History  Diagnosis Date  . Diabetes mellitus without complication (HCC)   . Hypothyroidism   . Coronary artery disease     s/p NSTEMI, PCI to LAD June 2014  . Hyperlipidemia     Past Surgical History  Procedure Laterality Date  . Left biceps tendon repair    . Knee cartilage surgery Bilateral   . Inguinal hernia repair Left   . Left heart catheterization with coronary angiogram N/A 08/03/2012    Procedure: LEFT HEART CATHETERIZATION WITH CORONARY ANGIOGRAM;  Surgeon: [name], MD;  [facility] C[facility] LAB;  Service: Cardiovascular;  Laterality: N/A;    ROS:  As stated in the HPI and negative for all other systems.  PHYSICAL EXAM BP 160/98 mmHg  Pulse 80  Ht  (1.88 m)  Wt 291 lb 5 oz (132.138 kg)  BMI 37.39 kg/m2 GENERAL:  Well appearing HEENT:  PERRL CHEST:  Porta cath in place NECK:  No jugular venous distention, waveform within normal limits, carotid upstroke brisk and symmetric, no bruits, no thyromegaly LUNGS:  Clear to auscultation bilaterally HEART:  PMI not displaced or sustained,S1 and S2 within normal  limits, no S3, no S4, no clicks, no rubs, no murmurs ABD:  Flat, positive bowel sounds normal in frequency in pitch, no bruits, no rebound, no guarding, no midline pulsatile mass, no hepatomegaly, no splenomegaly EXT:  2 plus pulses throughout, moderate edema, no cyanosis no clubbing NEURO:  Nonfocal  EKG:  Sinus rhythm, rate 80, axis within normal limits, intervals within normal limits, no acute ST-T wave changes.  04/13/2015   ASSESSMENT AND PLAN  CAD:   He had a negative stress test. He's feeling better. No further change in therapy is indicated.   EDEMA:  I will check an echocardiogram.  HTN:  The blood pressure is going up.  I will add chlorthalidone.  He will get a BMET in one week.  He gets BMETs  frequently.  He does have a creat of 1.8.   He will keep his feet up and use the compression stockings.   DYSLIPIDEMIA:  His LDL was only 31.  I will reduce the Lipitor to 20 mg daily.  ANEMIA:  His Hgb was 7.4 on the 14th but he has since had a transfusion.  I did review [place] records through [facility].

## 2015-05-01 ENCOUNTER — Other Ambulatory Visit: Payer: Self-pay

## 2015-05-01 ENCOUNTER — Ambulatory Visit (HOSPITAL_COMMUNITY): Payer: Medicare Other | Attending: Cardiovascular Disease

## 2015-05-01 DIAGNOSIS — R600 Localized edema: Secondary | ICD-10-CM | POA: Insufficient documentation

## 2015-05-01 DIAGNOSIS — E785 Hyperlipidemia, unspecified: Secondary | ICD-10-CM | POA: Diagnosis not present

## 2015-05-01 DIAGNOSIS — I313 Pericardial effusion (noninflammatory): Secondary | ICD-10-CM | POA: Diagnosis not present

## 2015-05-01 DIAGNOSIS — I517 Cardiomegaly: Secondary | ICD-10-CM | POA: Insufficient documentation

## 2015-05-01 DIAGNOSIS — I2510 Atherosclerotic heart disease of native coronary artery without angina pectoris: Secondary | ICD-10-CM | POA: Diagnosis not present

## 2015-05-01 DIAGNOSIS — I340 Nonrheumatic mitral (valve) insufficiency: Secondary | ICD-10-CM | POA: Diagnosis not present

## 2015-05-01 DIAGNOSIS — E119 Type 2 diabetes mellitus without complications: Secondary | ICD-10-CM | POA: Diagnosis not present

## 2015-05-08 ENCOUNTER — Telehealth: Payer: Self-pay | Admitting: *Deleted

## 2015-05-08 NOTE — Telephone Encounter (Signed)
Spoke with pt about his Echo, pt stated that the new medication Chlorthalidone 25 mg has break him out in rash on both arms, pt stated he stop taking medication and will be seeing his Dermatologist tomorrow 03/14, pt want to know if there is a different medication you will like him to take but he will not be taking Chlorthalidone.

## 2015-05-08 NOTE — Telephone Encounter (Signed)
-----   Message from Rollene RotundaJames Hochrein, MD sent at 05/07/2015 11:16 PM EDT ----- No evidence of LV dysfunction or pulmonary HTN.  No obvious etiology for cardiac cause of edema.

## 2015-05-09 MED ORDER — AMLODIPINE BESYLATE 2.5 MG PO TABS: 2.5000 mg | ORAL_TABLET | Freq: Every day | ORAL | Status: DC

## 2015-05-09 NOTE — Telephone Encounter (Signed)
Spoke with pt letting him know Amlodipine 2.5 mg was send into his pharmacy #30 with 11 refills

## 2015-05-09 NOTE — Addendum Note (Signed)
Addended by: Barrie DunkerHOMAS, [place]is[place] N on: 05/09/2015 11:44 AM   Modules accepted: Orders, Medications

## 2015-05-09 NOTE — Telephone Encounter (Signed)
Discontinue chlorthalidone and start Norvasc 2.5 mg po daily.  Disp number 30 with 11 refills.

## 2015-05-31 ENCOUNTER — Other Ambulatory Visit: Payer: Self-pay | Admitting: Cardiology

## 2015-05-31 NOTE — Telephone Encounter (Signed)
Rx(s) sent to pharmacy electronically.  

## 2015-07-05 ENCOUNTER — Telehealth: Payer: Self-pay | Admitting: Cardiology

## 2015-07-05 NOTE — Telephone Encounter (Signed)
Received records from [facility] for appointment on 07/17/15 with Dr Antoine PocheHochrein.  [facility] Dr Hochrein's schedule on 07/17/15. lp

## 2015-07-16 NOTE — Progress Notes (Signed)
HPI The patient is being seen for follow up after NSTEMI  on August 03, 2012.  He had proximal LAD disease and otherwise nonobstructive CAD with normal LV function. The LAD was successfully stented using a 3.0 x 24 mm Promus Premier drug-eluting stent.   He was diagnosed with transitional cell cancer of the kidney.  He has been treated with Gemzar.  He is doing OK with this.   He does have rising blood pressures and I reviewed his readings.  He also has had increased edema at the last visit.  Echo demonstrated no significant abnormalities. His edema is now improved. He was hypertensive. I did try chlorthalidone but had an allergic reaction to this. Now that and doing well. He patient denies any new symptoms such as chest discomfort, neck or arm discomfort. There has been no new PND or orthopnea. There have been no reported palpitations, presyncope or syncope.   Allergies  Allergen Reactions  . Chlorthalidone Swelling    Current Outpatient Prescriptions  Medication Sig Dispense Refill  . amLODipine (NORVASC) 2.5 MG tablet Take 1 tablet (2.5 mg total) by mouth daily. 30 tablet 11  . aspirin EC 81 MG tablet Take 81 mg by mouth daily.    Marland Kitchen. atorvastatin (LIPITOR) 20 MG tablet Take 20 mg by mouth daily.    . Coenzyme Q10 (COQ10 PO) Take 1 tablet by mouth daily.    . Dulaglutide (TRULICITY) 0.75 MG/0.5ML SOPN Inject 0.75 mg into the skin once a week. 4 mL 3  . Ferrous Sulfate (IRON) 325 (65 Fe) MG TABS Take 1 tablet by mouth daily.    Marland Kitchen. glyBURIDE-metformin (GLUCOVANCE) 5-500 MG tablet Take 1 tablet by mouth 2 (two) times daily. Take 1 tab by mouth twice a day with meals  5  . levothyroxine (SYNTHROID, LEVOTHROID) 112 MCG tablet Take 125 mcg by mouth daily before breakfast.     . metoprolol tartrate (LOPRESSOR) 25 MG tablet Take 1 tablet (25 mg total) by mouth 2 (two) times daily. 180 tablet 3  . nitroGLYCERIN (NITROSTAT) 0.4 MG SL tablet Place 1 tablet (0.4 mg total) under the tongue every 5 (five)  minutes x 3 doses as needed for chest pain. 25 tablet 3  . NON FORMULARY CHEMO INFUSION    . omeprazole (PRILOSEC OTC) 20 MG tablet Take 20 mg by mouth daily before supper.     No current facility-administered medications for this visit.    Past Medical History  Diagnosis Date  . Diabetes mellitus without complication (HCC)   . Hypothyroidism   . Coronary artery disease     s/p NSTEMI, PCI to LAD June 2014  . Hyperlipidemia     Past Surgical History  Procedure Laterality Date  . Left biceps tendon repair    . Knee cartilage surgery Bilateral   . Inguinal hernia repair Left   . Left heart catheterization with coronary angiogram N/A 08/03/2012    Procedure: LEFT HEART CATHETERIZATION WITH CORONARY ANGIOGRAM;  Surgeon: Tonny BollmanMichael Cooper, MD;  [facility];  Service: Cardiovascular;  Laterality: N/A;    ROS:  He has right breast pain.  Otherwise as stated in the HPI and negative for all other systems.  PHYSICAL EXAM BP 139/77 mmHg  Pulse 69  Ht 6\' 2"  (1.88 m)  Wt 273 lb (123.832 kg)  BMI 35.04 kg/m2 GENERAL:  Well appearing HEENT:  PERRL CHEST:  Porta cath in place NECK:  No jugular venous distention, waveform within normal limits, carotid upstroke brisk and  symmetric, no bruits, no thyromegaly LUNGS:  Clear to auscultation bilaterally HEART:  PMI not displaced or sustained,S1 and S2 within normal limits, no S3, no S4, no clicks, no rubs, no murmurs ABD:  Flat, positive bowel sounds normal in frequency in pitch, no bruits, no rebound, no guarding, no midline pulsatile mass, no hepatomegaly, no splenomegaly EXT:  2 plus pulses throughout, moderate edema, no cyanosis no clubbing NEURO:  Nonfocal   ASSESSMENT AND PLAN  CAD:   The patient has no new sypmtoms.  No further cardiovascular testing is indicated.  We will continue with aggressive risk reduction and meds as listed.  EDEMA:  This is improved and he will continue current meds.   HTN:  He will continue meds as  listed.  BP is OK.  It is mildly elevated but he will keep an eye on this.   DYSLIPIDEMIA:  His LDL was only 31.  I reduced the Lipitor to 20 mg daily.  He will have the lipid panel followed by Margaree Mackintosh, MD  ANEMIA:  His Hgb is low but improved.    CKD:  Creat is elevated but stable.  I reviewed recent labs.   I reviewed a renal office note.

## 2015-07-17 ENCOUNTER — Ambulatory Visit (INDEPENDENT_AMBULATORY_CARE_PROVIDER_SITE_OTHER): Payer: Medicare Other | Admitting: Cardiology

## 2015-07-17 ENCOUNTER — Encounter: Payer: Self-pay | Admitting: Cardiology

## 2015-07-17 VITALS — BP 139/77 | HR 69 | Ht 74.0 in | Wt 273.0 lb

## 2015-07-17 DIAGNOSIS — I2510 Atherosclerotic heart disease of native coronary artery without angina pectoris: Secondary | ICD-10-CM

## 2015-07-17 NOTE — Patient Instructions (Signed)
Medication Instructions:  Continue current medication therapy  Labwork: NONE  Testing/Procedures: NONE  Follow-Up: Your physician wants you to follow-up in: 6 Months. You will receive a reminder letter in the mail two months in advance. If you don't receive a letter, please call our office to schedule the follow-up appointment.  Any Other Special Instructions Will Be Listed Below (If Applicable).  If you need a refill on your cardiac medications before your next appointment, please call your pharmacy.

## 2016-04-22 ENCOUNTER — Other Ambulatory Visit: Payer: Self-pay | Admitting: Cardiology

## 2016-06-03 ENCOUNTER — Other Ambulatory Visit: Payer: Self-pay | Admitting: Cardiology

## 2016-06-06 ENCOUNTER — Other Ambulatory Visit: Payer: Self-pay | Admitting: Cardiology

## 2016-06-10 ENCOUNTER — Other Ambulatory Visit: Payer: Self-pay | Admitting: Cardiology

## 2016-06-11 NOTE — Telephone Encounter (Signed)
Rx(s) sent to pharmacy electronically.  

## 2016-06-29 ENCOUNTER — Encounter: Payer: Self-pay | Admitting: Nephrology

## 2016-08-25 DEATH — deceased
